# Patient Record
Sex: Female | Born: 1985
Health system: Southern US, Community
[De-identification: ages and names within clinical notes are randomized; demographics above are authoritative.]

## PROBLEM LIST (undated history)

## (undated) DIAGNOSIS — H539 Unspecified visual disturbance: Secondary | ICD-10-CM

## (undated) DIAGNOSIS — R002 Palpitations: Secondary | ICD-10-CM

## (undated) HISTORY — DX: Unspecified visual disturbance: H53.9

---
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
## 2004-05-17 ENCOUNTER — Encounter (INDEPENDENT_AMBULATORY_CARE_PROVIDER_SITE_OTHER): Payer: Self-pay | Admitting: *Deleted

## 2004-05-17 LAB — CONVERTED CEMR LAB

## 2004-10-16 ENCOUNTER — Ambulatory Visit: Payer: Self-pay | Admitting: Family Medicine

## 2007-01-15 ENCOUNTER — Encounter (INDEPENDENT_AMBULATORY_CARE_PROVIDER_SITE_OTHER): Payer: Self-pay | Admitting: *Deleted

## 2009-04-19 ENCOUNTER — Emergency Department (HOSPITAL_COMMUNITY): Admission: EM | Admit: 2009-04-19 | Discharge: 2009-04-19 | Payer: Self-pay | Admitting: Emergency Medicine

## 2012-08-13 ENCOUNTER — Encounter: Payer: Self-pay | Admitting: Obstetrics and Gynecology

## 2012-08-13 ENCOUNTER — Ambulatory Visit (INDEPENDENT_AMBULATORY_CARE_PROVIDER_SITE_OTHER): Payer: PRIVATE HEALTH INSURANCE | Admitting: Obstetrics and Gynecology

## 2012-08-13 VITALS — BP 100/60 | HR 72 | Resp 16 | Ht 65.0 in | Wt 136.0 lb

## 2012-08-13 DIAGNOSIS — Z124 Encounter for screening for malignant neoplasm of cervix: Secondary | ICD-10-CM

## 2012-08-13 DIAGNOSIS — Z309 Encounter for contraceptive management, unspecified: Secondary | ICD-10-CM

## 2012-08-13 DIAGNOSIS — N898 Other specified noninflammatory disorders of vagina: Secondary | ICD-10-CM

## 2012-08-13 DIAGNOSIS — N760 Acute vaginitis: Secondary | ICD-10-CM

## 2012-08-13 DIAGNOSIS — Z113 Encounter for screening for infections with a predominantly sexual mode of transmission: Secondary | ICD-10-CM

## 2012-08-13 DIAGNOSIS — A499 Bacterial infection, unspecified: Secondary | ICD-10-CM

## 2012-08-13 DIAGNOSIS — IMO0001 Reserved for inherently not codable concepts without codable children: Secondary | ICD-10-CM

## 2012-08-13 LAB — POCT WET PREP (WET MOUNT): Clue Cells Wet Prep Whiff POC: POSITIVE

## 2012-08-13 MED ORDER — MEDROXYPROGESTERONE ACETATE 150 MG/ML IM SUSP: 150.0000 mg | Freq: Once | INTRAMUSCULAR | Status: DC

## 2012-08-13 MED ORDER — TINIDAZOLE 500 MG PO TABS: 2.0000 g | ORAL_TABLET | Freq: Every day | ORAL | Status: DC

## 2012-08-13 NOTE — Progress Notes (Signed)
Contraception Condoms Last pap 11/2011 ? WNL Last Mammo None Last Colonoscopy None Last Dexa Scan None Primary MD Cindy Vertifaye Abuse at Home None  No complaints.  Wants to restart BC/Depo.  Cycle due to start on Sat or Sun.  Filed Vitals:   08/13/12 1406  BP: 100/60  Pulse: 72  Resp: 16   ROS: noncontributory  Physical Examination: General appearance - alert, well appearing, and in no distress Neck - supple, no significant adenopathy Chest - clear to auscultation, no wheezes, rales or rhonchi, symmetric air entry Heart - normal rate and regular rhythm Abdomen - soft, nontender, nondistended, no masses or organomegaly Breasts - breasts appear normal, no suspicious masses, no skin or nipple changes or axillary nodes Pelvic - normal external genitalia, vulva, vagina, cervix, uterus and adnexa, white d/c Back exam - no CVAT Extremities - no edema, redness or tenderness in the calves or thighs  Results for orders placed in visit on 08/13/12  POCT WET PREP (WET [place])      Component Value Range   Source Wet Prep POC       WBC, Wet Prep HPF POC neg     Bacteria Wet Prep HPF POC few     BACTERIA WET PREP MORPHOLOGY POC       Clue Cells Wet Prep HPF POC Few     CLUE CELLS WET PREP WHIFF POC Positive Whiff     Yeast Wet Prep HPF POC None     KOH Wet Prep POC       Trichomonas Wet Prep HPF POC neg     pH 5.0      A/P Pap today and STD testing with consent Wet Prep - BV - Tindamax Depo Provera - lab visit only on Mon with neg UPT (PT expected to be on cycle)

## 2012-08-14 LAB — RPR

## 2012-08-14 LAB — HEPATITIS B SURFACE ANTIGEN: Hepatitis B Surface Ag: NEGATIVE

## 2012-08-16 ENCOUNTER — Other Ambulatory Visit (INDEPENDENT_AMBULATORY_CARE_PROVIDER_SITE_OTHER): Payer: PRIVATE HEALTH INSURANCE

## 2012-08-16 DIAGNOSIS — Z3009 Encounter for other general counseling and advice on contraception: Secondary | ICD-10-CM

## 2012-08-16 LAB — HSV 1 ANTIBODY, IGG: HSV 1 Glycoprotein G Ab, IgG: <0.1 IV

## 2012-08-16 LAB — POCT URINE PREGNANCY: Preg Test, Ur: NEGATIVE

## 2012-08-16 LAB — HSV 2 ANTIBODY, IGG: HSV 2 Glycoprotein G Ab, IgG: <0.1 IV

## 2012-08-16 MED ORDER — MEDROXYPROGESTERONE ACETATE 150 MG/ML IM SUSP
150.0000 mg | Freq: Once | INTRAMUSCULAR | Status: AC
  Administered 2012-08-16: 150 mg via INTRAMUSCULAR

## 2012-08-16 NOTE — Progress Notes (Unsigned)
Next Depo due-11-07-2012

## 2012-08-18 LAB — PAP IG, CT-NG, RFX HPV ASCU: Chlamydia Probe Amp: NEGATIVE

## 2015-05-07 ENCOUNTER — Other Ambulatory Visit (HOSPITAL_COMMUNITY): Payer: Self-pay | Admitting: Obstetrics and Gynecology

## 2015-05-07 DIAGNOSIS — N7011 Chronic salpingitis: Secondary | ICD-10-CM

## 2015-05-10 ENCOUNTER — Ambulatory Visit (HOSPITAL_COMMUNITY): Payer: Self-pay

## 2016-12-11 ENCOUNTER — Encounter (HOSPITAL_COMMUNITY): Payer: Self-pay

## 2016-12-11 ENCOUNTER — Emergency Department (HOSPITAL_COMMUNITY): Payer: BLUE CROSS/BLUE SHIELD

## 2016-12-11 ENCOUNTER — Inpatient Hospital Stay (HOSPITAL_COMMUNITY)
Admission: EM | Admit: 2016-12-11 | Discharge: 2016-12-13 | DRG: 123 | Disposition: A | Discharge status: Home / Self Care | Payer: BLUE CROSS/BLUE SHIELD | Attending: Internal Medicine | Admitting: Internal Medicine

## 2016-12-11 DIAGNOSIS — H538 Other visual disturbances: Secondary | ICD-10-CM | POA: Diagnosis not present

## 2016-12-11 DIAGNOSIS — H469 Unspecified optic neuritis: Principal | ICD-10-CM | POA: Diagnosis present

## 2016-12-11 DIAGNOSIS — G379 Demyelinating disease of central nervous system, unspecified: Secondary | ICD-10-CM

## 2016-12-11 DIAGNOSIS — Z87891 Personal history of nicotine dependence: Secondary | ICD-10-CM

## 2016-12-11 DIAGNOSIS — G35 Multiple sclerosis: Secondary | ICD-10-CM | POA: Diagnosis present

## 2016-12-11 DIAGNOSIS — Z833 Family history of diabetes mellitus: Secondary | ICD-10-CM

## 2016-12-11 HISTORY — DX: Palpitations: R00.2

## 2016-12-11 LAB — I-STAT CHEM 8, ED
BUN: 7 mg/dL (ref 6–20)
CHLORIDE: 105 mmol/L (ref 101–111)
CREATININE: 0.8 mg/dL (ref 0.44–1.00)
Calcium, Ion: 1.14 mmol/L — ABNORMAL LOW (ref 1.15–1.40)
GLUCOSE: 92 mg/dL (ref 65–99)
HEMATOCRIT: 39 % (ref 36.0–46.0)
HEMOGLOBIN: 13.3 g/dL (ref 12.0–15.0)
POTASSIUM: 3.3 mmol/L — AB (ref 3.5–5.1)
Sodium: 139 mmol/L (ref 135–145)
TCO2: 21 mmol/L (ref 0–100)

## 2016-12-11 LAB — CBC WITH DIFFERENTIAL/PLATELET
BASOS ABS: 0 10*3/uL (ref 0.0–0.1)
BASOS PCT: 1 %
Eosinophils Absolute: 0.1 10*3/uL (ref 0.0–0.7)
Eosinophils Relative: 1 %
HEMATOCRIT: 37.1 % (ref 36.0–46.0)
HEMOGLOBIN: 12.4 g/dL (ref 12.0–15.0)
LYMPHS PCT: 48 %
Lymphs Abs: 2.9 10*3/uL (ref 0.7–4.0)
MCH: 28.6 pg (ref 26.0–34.0)
MCHC: 33.4 g/dL (ref 30.0–36.0)
MCV: 85.5 fL (ref 78.0–100.0)
MONO ABS: 0.4 10*3/uL (ref 0.1–1.0)
Monocytes Relative: 6 %
NEUTROS ABS: 2.6 10*3/uL (ref 1.7–7.7)
NEUTROS PCT: 44 %
Platelets: 238 10*3/uL (ref 150–400)
RBC: 4.34 MIL/uL (ref 3.87–5.11)
RDW: 12.5 % (ref 11.5–15.5)
WBC: 5.9 10*3/uL (ref 4.0–10.5)

## 2016-12-11 MED ORDER — GADOBENATE DIMEGLUMINE 529 MG/ML IV SOLN
10.0000 mL | Freq: Once | INTRAVENOUS | Status: AC | PRN
  Administered 2016-12-11: 10 mL via INTRAVENOUS

## 2016-12-11 MED ORDER — SODIUM CHLORIDE 0.9 % IV SOLN
1000.0000 mg | Freq: Once | INTRAVENOUS | Status: AC
  Administered 2016-12-12: 1000 mg via INTRAVENOUS
  Filled 2016-12-11: qty 8

## 2016-12-11 NOTE — ED Triage Notes (Signed)
Per Pt, Pt is coming from opthalmology where she was evaluated for blurred vision and pain behind the left eye. Pt was sent her to r/o optic neuritis with MRI.

## 2016-12-11 NOTE — ED Provider Notes (Signed)
[facility]-EMERGENCY DEPT Provider Note   CSN: [id number] Arrival date & time: 12/11/16  1652     History   Chief Complaint Chief Complaint  Patient presents with  . Blurred Vision    HPI Brianna Sanders is a 31 y.o. female.  HPI Brianna Sanders is a 31 y.o. female with no medical problems, presents to emergency department complaining of increased blurred vision left eye over the last 2 weeks. Patient states her symptoms started with peripheral vision blurriness in the left eye about a week and a half ago. It then progressed to having pain in that eye. She went and saw her ophthalmologist today, had extensive testing done, which showed decreased left peripheral field vision and inflammation around the nerve. She was sent here to rule out optic neuritis by Dr. Clelia Croft. Patient denies any other complaints. No associated symptoms other than a headache. Denies numbness or weakness in extremities. No history of eye problems other than decreased visual acuity for which she wears contacts and glasses. Denies any eye trauma. Nothing making her symptoms better or worse.   Past Medical History:  Diagnosis Date  . Palpitations     Patient Active Problem List   Diagnosis Date Noted  . Contraception - plans Depo 08/13/2012    History reviewed. No pertinent surgical history.  OB History    Gravida Para Term Preterm AB Living   0             SAB TAB Ectopic Multiple Live Births                   Home Medications    Prior to Admission medications   Medication Sig Start Date End Date Taking? Authorizing Provider  ibuprofen (ADVIL,MOTRIN) 800 MG tablet Take 800 mg by mouth every 8 (eight) hours as needed for cramping.   Yes Historical Provider, MD  prednisoLONE acetate (PRED FORTE) 1 % ophthalmic suspension Place 1 drop into the left eye every 6 (six) hours. 12/05/16  Yes Historical Provider, MD    Family History Family History  Problem Relation Age of Onset  . Diabetes Mother      Social History Social History  Substance Use Topics  . Smoking status: Former Smoker    Quit date: 01/14/2012  . Smokeless tobacco: Never Used  . Alcohol use Yes     Allergies   Patient has no known allergies.   Review of Systems Review of Systems  Constitutional: Negative for chills and fever.  Eyes: Positive for pain and visual disturbance. Negative for photophobia, discharge and redness.  Respiratory: Negative for cough, chest tightness and shortness of breath.   Cardiovascular: Negative for chest pain, palpitations and leg swelling.  Gastrointestinal: Negative for abdominal pain, diarrhea, nausea and vomiting.  Genitourinary: Negative for dysuria, flank pain and pelvic pain.  Musculoskeletal: Negative for arthralgias, myalgias, neck pain and neck stiffness.  Skin: Negative for rash.  Neurological: Positive for headaches. Negative for dizziness and weakness.  All other systems reviewed and are negative.    Physical Exam Updated Vital Signs BP 118/88 (BP Location: Right Arm)   Pulse 97   Temp 98.5 F (36.9 C) (Oral)   Resp 18   Ht 5\' 5"  (1.651 m)   Wt 56.7 kg   LMP 12/10/2016   SpO2 99%   BMI 20.80 kg/m   Physical Exam  Constitutional: She appears well-developed and well-nourished. No distress.  HENT:  Head: Normocephalic.  Eyes: Conjunctivae and EOM are normal. Pupils are equal, round,  and reactive to light.  Pain with lateral gaze in the left eye  Neck: Neck supple.  Cardiovascular: Normal rate, regular rhythm and normal heart sounds.   Pulmonary/Chest: Effort normal and breath sounds normal. No respiratory distress. She has no wheezes. She has no rales.  Musculoskeletal: She exhibits no edema.  Neurological: She is alert.  Deficit and left lateral visual fields. Otherwise normal neurological exam.5/5 and equal upper and lower extremity strength bilaterally. Equal grip strength bilaterally. Normal finger to nose and heel to shin. No pronator drift.  Patellar reflexes 2+   Skin: Skin is warm and dry.  Psychiatric: She has a normal mood and affect. Her behavior is normal.  Nursing note and vitals reviewed.    ED Treatments / Results  Labs (all labs ordered are listed, but only abnormal results are displayed) Labs Reviewed - No data to display  EKG  EKG Interpretation None       Radiology Mr Laqueta Jean And Wo Contrast  Result Date: 12/11/2016 CLINICAL DATA:  Initial evaluation for acute blurry vision with pain behind left eye. EXAM: MRI HEAD AND ORBITS WITHOUT AND WITH CONTRAST TECHNIQUE: Multiplanar, multiecho pulse sequences of the brain and surrounding structures were obtained without and with intravenous contrast. Multiplanar, multiecho pulse sequences of the orbits and surrounding structures were obtained including fat saturation techniques, before and after intravenous contrast administration. CONTRAST:  10mL MULTIHANCE GADOBENATE DIMEGLUMINE 529 MG/ML IV SOLN COMPARISON:  None available. FINDINGS: MRI HEAD FINDINGS Brain: Cerebral volume within normal limits for age. There are several scattered patchy T2/FLAIR hyperintense lesions involving predominantly in the periventricular white matter of both cerebral hemispheres, with a few additional small subcortical lesions present. Many of these lesions are oriented perpendicular to the lateral ventricles, and demonstrate precontrast T1 hypointense signal intensity. Overall distribution appearance is most consistent with underlying demyelinating disease, multiple sclerosis. Faint diffusion abnormality with associated enhancement about a few of these lesions is most consistent with active demyelination. No infratentorial lesions identified. No other evidence for acute infarct. No evidence for acute or chronic intracranial hemorrhage. No mass lesion, midline shift or mass effect. No hydrocephalus. Major dural sinuses are grossly patent. Pituitary gland mildly prominent with convex border  superiorly, likely within normal limits for age. Vascular: Major intracranial vascular flow voids are maintained. Skull and upper cervical spine: Craniocervical junction normal. Visualized upper cervical spine unremarkable. Bone marrow signal intensity somewhat diffusely decreased on T1 weighted signal intensity, most commonly related to anemia, smoking, and obesity. Scalp soft tissues demonstrate no acute abnormality. Other: Retention cyst noted within the right maxillary sinus. Scattered mucosal thickening within the ethmoidal air cells. Paranasal sinuses are otherwise clear. No mastoid effusion. Inner ear structures grossly normal. MRI ORBITS FINDINGS Orbits: Globes are symmetric in size with normal appearance. Extra-ocular muscles symmetric and within normal limits. Lacrimal glands normal. Superior ophthalmic veins within normal limits. Left optic nerve is somewhat diffusely enlarged as compared to the right. Scattered T2 hyperintensity surrounds the optic nerve and is present within the nerve itself, suggesting edema. There is associated fuzzy perineural enhancement, with additional discontinue areas of enhancement within the nerve itself. Changes consistent with acute optic neuritis. Changes extend to near the level of the left orbital apex. Optic chiasm itself is normal in appearance. Right optic nerve is normal in appearance. Intraconal and extraconal fat well-maintained. Visualized sinuses: Mucous retention cyst right maxillary sinus with scattered mucosal thickening within the ethmoidal air cells. Paranasal sinuses are otherwise clear. Soft tissues: Periorbital soft tissues within  normal limits. IMPRESSION: 1. Cerebral white matter disease in a distribution most consistent with demyelinating disease/multiple sclerosis. Mild enhancement about a few scattered lesions consistent with active demyelination. 2. Findings consistent with acute left optic neuritis as above. Electronically Signed   By: Rise Mu M.D.   On: 12/11/2016 23:39   Mr Rockwell [place] QM Contrast  Result Date: 12/11/2016 CLINICAL DATA:  Initial evaluation for acute blurry vision with pain behind left eye. EXAM: MRI HEAD AND ORBITS WITHOUT AND WITH CONTRAST TECHNIQUE: Multiplanar, multiecho pulse sequences of the brain and surrounding structures were obtained without and with intravenous contrast. Multiplanar, multiecho pulse sequences of the orbits and surrounding structures were obtained including fat saturation techniques, before and after intravenous contrast administration. CONTRAST:  10mL MULTIHANCE GADOBENATE DIMEGLUMINE 529 MG/ML IV SOLN COMPARISON:  None available. FINDINGS: MRI HEAD FINDINGS Brain: Cerebral volume within normal limits for age. There are several scattered patchy T2/FLAIR hyperintense lesions involving predominantly in the periventricular white matter of both cerebral hemispheres, with a few additional small subcortical lesions present. Many of these lesions are oriented perpendicular to the lateral ventricles, and demonstrate precontrast T1 hypointense signal intensity. Overall distribution appearance is most consistent with underlying demyelinating disease, multiple sclerosis. Faint diffusion abnormality with associated enhancement about a few of these lesions is most consistent with active demyelination. No infratentorial lesions identified. No other evidence for acute infarct. No evidence for acute or chronic intracranial hemorrhage. No mass lesion, midline shift or mass effect. No hydrocephalus. Major dural sinuses are grossly patent. Pituitary gland mildly prominent with convex border superiorly, likely within normal limits for age. Vascular: Major intracranial vascular flow voids are maintained. Skull and upper cervical spine: Craniocervical junction normal. Visualized upper cervical spine unremarkable. Bone marrow signal intensity somewhat diffusely decreased on T1 weighted signal intensity, most commonly  related to anemia, smoking, and obesity. Scalp soft tissues demonstrate no acute abnormality. Other: Retention cyst noted within the right maxillary sinus. Scattered mucosal thickening within the ethmoidal air cells. Paranasal sinuses are otherwise clear. No mastoid effusion. Inner ear structures grossly normal. MRI ORBITS FINDINGS Orbits: Globes are symmetric in size with normal appearance. Extra-ocular muscles symmetric and within normal limits. Lacrimal glands normal. Superior ophthalmic veins within normal limits. Left optic nerve is somewhat diffusely enlarged as compared to the right. Scattered T2 hyperintensity surrounds the optic nerve and is present within the nerve itself, suggesting edema. There is associated fuzzy perineural enhancement, with additional discontinue areas of enhancement within the nerve itself. Changes consistent with acute optic neuritis. Changes extend to near the level of the left orbital apex. Optic chiasm itself is normal in appearance. Right optic nerve is normal in appearance. Intraconal and extraconal fat well-maintained. Visualized sinuses: Mucous retention cyst right maxillary sinus with scattered mucosal thickening within the ethmoidal air cells. Paranasal sinuses are otherwise clear. Soft tissues: Periorbital soft tissues within normal limits. IMPRESSION: 1. Cerebral white matter disease in a distribution most consistent with demyelinating disease/multiple sclerosis. Mild enhancement about a few scattered lesions consistent with active demyelination. 2. Findings consistent with acute left optic neuritis as above. Electronically Signed   By: Rise Mu M.D.   On: 12/11/2016 23:39    Procedures Procedures (including critical care time)  Medications Ordered in ED Medications - No data to display   Initial Impression / Assessment and Plan / ED Course  I have reviewed the triage vital signs and the nursing notes.  Pertinent labs & imaging results that were  available during my care of  the patient were reviewed by me and considered in my medical decision making (see chart for details).    Patient in emergency department with visual changes, sent by ophthalmologist with concern for optic neuritis. MRI of the brain and orbits ordered.  12:11 AM MRI with findings consistent with optic neuritis along with demyelinating lesions in cerebral white matter disease. Discussed with Dr. Vonita Moss with neurology, he will come and consult. Patient was given 1 g of Solu-Medrol IV. Results discussed with patient. Will call medicine for admission.  Spoke with hospitalist, will admit.    Final Clinical Impressions(s) / ED Diagnoses   Final diagnoses:  Blurred vision  Optic neuritis  Demyelinating changes in brain [facility])    New Prescriptions    Jaynie Crumble, PA-C 12/13/16 2219    Linwood Dibbles, MD 12/16/16 [phone]

## 2016-12-12 DIAGNOSIS — H538 Other visual disturbances: Secondary | ICD-10-CM | POA: Diagnosis present

## 2016-12-12 DIAGNOSIS — H469 Unspecified optic neuritis: Secondary | ICD-10-CM | POA: Diagnosis not present

## 2016-12-12 DIAGNOSIS — G379 Demyelinating disease of central nervous system, unspecified: Secondary | ICD-10-CM | POA: Diagnosis not present

## 2016-12-12 DIAGNOSIS — G35 Multiple sclerosis: Secondary | ICD-10-CM | POA: Diagnosis present

## 2016-12-12 DIAGNOSIS — Z87891 Personal history of nicotine dependence: Secondary | ICD-10-CM | POA: Diagnosis not present

## 2016-12-12 DIAGNOSIS — Z833 Family history of diabetes mellitus: Secondary | ICD-10-CM | POA: Diagnosis not present

## 2016-12-12 LAB — GLUCOSE, CAPILLARY
GLUCOSE-CAPILLARY: 106 mg/dL — AB (ref 65–99)
GLUCOSE-CAPILLARY: 134 mg/dL — AB (ref 65–99)
GLUCOSE-CAPILLARY: 154 mg/dL — AB (ref 65–99)
Glucose-Capillary: 163 mg/dL — ABNORMAL HIGH (ref 65–99)

## 2016-12-12 MED ORDER — SODIUM CHLORIDE 0.9 % IV SOLN
1000.0000 mg | INTRAVENOUS | Status: DC
  Administered 2016-12-13: 1000 mg via INTRAVENOUS
  Filled 2016-12-12: qty 8

## 2016-12-12 MED ORDER — ENOXAPARIN SODIUM 40 MG/0.4ML ~~LOC~~ SOLN
40.0000 mg | SUBCUTANEOUS | Status: DC
  Administered 2016-12-12 – 2016-12-13 (×2): 40 mg via SUBCUTANEOUS
  Filled 2016-12-12 (×2): qty 0.4

## 2016-12-12 NOTE — Progress Notes (Signed)
PROGRESS NOTE  Alizae Kubicek ZOX:[id number] DOB: [birth year] DOA: 12/11/2016 PCP: Aurelio Brash, NP  HPI/Recap of past 24 hours:  Feeling better, no pain, no fever, no weakness Sister in room  Assessment/Plan: Principal Problem:   Optic neuritis Active Problems:   Demyelinating changes in brain [facility])  Multiple sclerosis/left optic neuritis: Neurology consulted , on high dose steroids. Will follow neurology recommendations.   Code Status: full  Family Communication: patient and sister at bedside  Disposition Plan: home after finishing iv solumedrol   Consultants:  neurology  Procedures:  none  Antibiotics:  none   Objective: BP 108/64 (BP Location: Left Arm)   Pulse 73   Temp 97.9 F (36.6 C) (Oral)   Resp 18   Ht 5\' 5"  (1.651 m)   Wt 55.3 kg (122 lb)   LMP 12/10/2016   SpO2 100%   BMI 20.30 kg/m  No intake or output data in the 24 hours ending 12/12/16 0810 Filed Weights   12/11/16 1702 12/12/16 0254  Weight: 56.7 kg (125 lb) 55.3 kg (122 lb)    Exam:   General:  NAD  Cardiovascular: RRR  Respiratory: CTABL  Abdomen: Soft/ND/NT, positive BS  Musculoskeletal: No Edema  Neuro: blurry vision left eye, no weakness  Data Reviewed: Basic Metabolic Panel:  Recent Labs Lab 12/11/16 2151  NA 139  K 3.3*  CL 105  GLUCOSE 92  BUN 7  CREATININE 0.80   Liver Function Tests: No results for input(s): AST, ALT, ALKPHOS, BILITOT, PROT, ALBUMIN in the last 168 hours. No results for input(s): LIPASE, AMYLASE in the last 168 hours. No results for input(s): AMMONIA in the last 168 hours. CBC:  Recent Labs Lab 12/11/16 2149 12/11/16 2151  WBC 5.9  --   NEUTROABS 2.6  --   HGB 12.4 13.3  HCT 37.1 39.0  MCV 85.5  --   PLT 238  --    Cardiac Enzymes:   No results for input(s): CKTOTAL, CKMB, CKMBINDEX, TROPONINI in the last 168 hours. BNP (last 3 results) No results for input(s): BNP in the last 8760 hours.  ProBNP (last  3 results) No results for input(s): PROBNP in the last 8760 hours.  CBG:  Recent Labs Lab 12/12/16 0630  GLUCAP 106*    No results found for this or any previous visit (from the past 240 hour(s)).   Studies: Mr Laqueta Jean And Wo Contrast  Result Date: 12/11/2016 CLINICAL DATA:  Initial evaluation for acute blurry vision with pain behind left eye. EXAM: MRI HEAD AND ORBITS WITHOUT AND WITH CONTRAST TECHNIQUE: Multiplanar, multiecho pulse sequences of the brain and surrounding structures were obtained without and with intravenous contrast. Multiplanar, multiecho pulse sequences of the orbits and surrounding structures were obtained including fat saturation techniques, before and after intravenous contrast administration. CONTRAST:  10mL MULTIHANCE GADOBENATE DIMEGLUMINE 529 MG/ML IV SOLN COMPARISON:  None available. FINDINGS: MRI HEAD FINDINGS Brain: Cerebral volume within normal limits for age. There are several scattered patchy T2/FLAIR hyperintense lesions involving predominantly in the periventricular white matter of both cerebral hemispheres, with a few additional small subcortical lesions present. Many of these lesions are oriented perpendicular to the lateral ventricles, and demonstrate precontrast T1 hypointense signal intensity. Overall distribution appearance is most consistent with underlying demyelinating disease, multiple sclerosis. Faint diffusion abnormality with associated enhancement about a few of these lesions is most consistent with active demyelination. No infratentorial lesions identified. No other evidence for acute infarct. No evidence for acute or chronic intracranial hemorrhage.  No mass lesion, midline shift or mass effect. No hydrocephalus. Major dural sinuses are grossly patent. Pituitary gland mildly prominent with convex border superiorly, likely within normal limits for age. Vascular: Major intracranial vascular flow voids are maintained. Skull and upper cervical spine:  Craniocervical junction normal. Visualized upper cervical spine unremarkable. Bone marrow signal intensity somewhat diffusely decreased on T1 weighted signal intensity, most commonly related to anemia, smoking, and obesity. Scalp soft tissues demonstrate no acute abnormality. Other: Retention cyst noted within the right maxillary sinus. Scattered mucosal thickening within the ethmoidal air cells. Paranasal sinuses are otherwise clear. No mastoid effusion. Inner ear structures grossly normal. MRI ORBITS FINDINGS Orbits: Globes are symmetric in size with normal appearance. Extra-ocular muscles symmetric and within normal limits. Lacrimal glands normal. Superior ophthalmic veins within normal limits. Left optic nerve is somewhat diffusely enlarged as compared to the right. Scattered T2 hyperintensity surrounds the optic nerve and is present within the nerve itself, suggesting edema. There is associated fuzzy perineural enhancement, with additional discontinue areas of enhancement within the nerve itself. Changes consistent with acute optic neuritis. Changes extend to near the level of the left orbital apex. Optic chiasm itself is normal in appearance. Right optic nerve is normal in appearance. Intraconal and extraconal fat well-maintained. Visualized sinuses: Mucous retention cyst right maxillary sinus with scattered mucosal thickening within the ethmoidal air cells. Paranasal sinuses are otherwise clear. Soft tissues: Periorbital soft tissues within normal limits. IMPRESSION: 1. Cerebral white matter disease in a distribution most consistent with demyelinating disease/multiple sclerosis. Mild enhancement about a few scattered lesions consistent with active demyelination. 2. Findings consistent with acute left optic neuritis as above. Electronically Signed   By: Rise Mu M.D.   On: 12/11/2016 23:39   Mr Rockwell [place] ZO Contrast  Result Date: 12/11/2016 CLINICAL DATA:  Initial evaluation for acute blurry  vision with pain behind left eye. EXAM: MRI HEAD AND ORBITS WITHOUT AND WITH CONTRAST TECHNIQUE: Multiplanar, multiecho pulse sequences of the brain and surrounding structures were obtained without and with intravenous contrast. Multiplanar, multiecho pulse sequences of the orbits and surrounding structures were obtained including fat saturation techniques, before and after intravenous contrast administration. CONTRAST:  10mL MULTIHANCE GADOBENATE DIMEGLUMINE 529 MG/ML IV SOLN COMPARISON:  None available. FINDINGS: MRI HEAD FINDINGS Brain: Cerebral volume within normal limits for age. There are several scattered patchy T2/FLAIR hyperintense lesions involving predominantly in the periventricular white matter of both cerebral hemispheres, with a few additional small subcortical lesions present. Many of these lesions are oriented perpendicular to the lateral ventricles, and demonstrate precontrast T1 hypointense signal intensity. Overall distribution appearance is most consistent with underlying demyelinating disease, multiple sclerosis. Faint diffusion abnormality with associated enhancement about a few of these lesions is most consistent with active demyelination. No infratentorial lesions identified. No other evidence for acute infarct. No evidence for acute or chronic intracranial hemorrhage. No mass lesion, midline shift or mass effect. No hydrocephalus. Major dural sinuses are grossly patent. Pituitary gland mildly prominent with convex border superiorly, likely within normal limits for age. Vascular: Major intracranial vascular flow voids are maintained. Skull and upper cervical spine: Craniocervical junction normal. Visualized upper cervical spine unremarkable. Bone marrow signal intensity somewhat diffusely decreased on T1 weighted signal intensity, most commonly related to anemia, smoking, and obesity. Scalp soft tissues demonstrate no acute abnormality. Other: Retention cyst noted within the right maxillary  sinus. Scattered mucosal thickening within the ethmoidal air cells. Paranasal sinuses are otherwise clear. No mastoid effusion. Inner ear structures grossly normal.  MRI ORBITS FINDINGS Orbits: Globes are symmetric in size with normal appearance. Extra-ocular muscles symmetric and within normal limits. Lacrimal glands normal. Superior ophthalmic veins within normal limits. Left optic nerve is somewhat diffusely enlarged as compared to the right. Scattered T2 hyperintensity surrounds the optic nerve and is present within the nerve itself, suggesting edema. There is associated fuzzy perineural enhancement, with additional discontinue areas of enhancement within the nerve itself. Changes consistent with acute optic neuritis. Changes extend to near the level of the left orbital apex. Optic chiasm itself is normal in appearance. Right optic nerve is normal in appearance. Intraconal and extraconal fat well-maintained. Visualized sinuses: Mucous retention cyst right maxillary sinus with scattered mucosal thickening within the ethmoidal air cells. Paranasal sinuses are otherwise clear. Soft tissues: Periorbital soft tissues within normal limits. IMPRESSION: 1. Cerebral white matter disease in a distribution most consistent with demyelinating disease/multiple sclerosis. Mild enhancement about a few scattered lesions consistent with active demyelination. 2. Findings consistent with acute left optic neuritis as above. Electronically Signed   By: Rise Mu M.D.   On: 12/11/2016 23:39    Scheduled Meds: . enoxaparin (LOVENOX) injection  40 mg Subcutaneous Q24H  . [START ON 12/13/2016] methylPREDNISolone (SOLU-MEDROL) injection  1,000 mg Intravenous Q24H    Continuous Infusions:    Adalida Garver MD, PhD  [facility] Pager [phone]. If 7PM-7AM, please contact night-coverage at [email or web address], password [facility] 12/12/2016, 8:10 AM  LOS: 0 days

## 2016-12-12 NOTE — Progress Notes (Signed)
Pt does have a PCP and insurance Herbalist). Information faxed to financial counseling. Pt sees Dr Patsey Berthold for her PCP. CM following for further needs.

## 2016-12-12 NOTE — H&P (Signed)
History and Physical    Sokha Kramm WUJ:[id number] DOB: Apr 10, 1986 DOA: 12/11/2016   PCP: Aurelio Brash, NP Chief Complaint:  Chief Complaint  Patient presents with  . Blurred Vision    HPI: Meganne Daidone is a 31 y.o. female with medical history significant of Previously healthy.  Patient presents to the ED with c/o blurred vision and pain behind L eye.  Patient is sent in by opthomology after their physical exam was worrisome for optic neuritis!  Symptoms onset 2 weeks ago, progressively worsening.  Nothing makes symptoms better or worse.  As mentioned above saw optho today who sent her in to ED rule out optic neuritis.  ED Course: MRI findings c/w L optic neuritis, and multiple sclerosis.  Review of Systems: As per HPI otherwise 10 point review of systems negative.    Past Medical History:  Diagnosis Date  . Palpitations     History reviewed. No pertinent surgical history.   reports that she quit smoking about 4 years ago. She has never used smokeless tobacco. She reports that she drinks alcohol. She reports that she does not use drugs.  No Known Allergies  Family History  Problem Relation Age of Onset  . Diabetes Mother       Prior to Admission medications   Medication Sig Start Date End Date Taking? Authorizing Provider  ibuprofen (ADVIL,MOTRIN) 800 MG tablet Take 800 mg by mouth every 8 (eight) hours as needed for cramping.   Yes Historical Provider, MD  prednisoLONE acetate (PRED FORTE) 1 % ophthalmic suspension Place 1 drop into the left eye every 6 (six) hours. 12/05/16  Yes Historical Provider, MD    Physical Exam: Vitals:   12/11/16 1702 12/11/16 2115 12/11/16 2145 12/12/16 0136  BP: 118/88 130/88 118/86 120/82  Pulse: 97 87 92 77  Resp: 18 25 20 18   Temp: 98.5 F (36.9 C)     TempSrc: Oral     SpO2: 99% 100% 100% 100%  Weight: 56.7 kg (125 lb)     Height: 5\' 5"  (1.651 m)         Constitutional: NAD, calm, comfortable Eyes:  PERRL, lids and conjunctivae normal ENMT: Mucous membranes are moist. Posterior pharynx clear of any exudate or lesions.Normal dentition.  Neck: normal, supple, no masses, no thyromegaly Respiratory: clear to auscultation bilaterally, no wheezing, no crackles. Normal respiratory effort. No accessory muscle use.  Cardiovascular: Regular rate and rhythm, no murmurs / rubs / gallops. No extremity edema. 2+ pedal pulses. No carotid bruits.  Abdomen: no tenderness, no masses palpated. No hepatosplenomegaly. Bowel sounds positive.  Musculoskeletal: no clubbing / cyanosis. No joint deformity upper and lower extremities. Good ROM, no contractures. Normal muscle tone.  Skin: no rashes, lesions, ulcers. No induration Neurologic: CN 2-12 grossly intact. Sensation intact, DTR normal. Strength 5/5 in all 4.  Psychiatric: Normal judgment and insight. Alert and oriented x 3. Normal mood.    Labs on Admission: I have personally reviewed following labs and imaging studies  CBC:  Recent Labs Lab 12/11/16 2149 12/11/16 2151  WBC 5.9  --   NEUTROABS 2.6  --   HGB 12.4 13.3  HCT 37.1 39.0  MCV 85.5  --   PLT 238  --    Basic Metabolic Panel:  Recent Labs Lab 12/11/16 2151  NA 139  K 3.3*  CL 105  GLUCOSE 92  BUN 7  CREATININE 0.80   GFR: Estimated Creatinine Clearance: 91.2 mL/min (by C-G formula based on SCr of 0.8 mg/dL).  Liver Function Tests: No results for input(s): AST, ALT, ALKPHOS, BILITOT, PROT, ALBUMIN in the last 168 hours. No results for input(s): LIPASE, AMYLASE in the last 168 hours. No results for input(s): AMMONIA in the last 168 hours. Coagulation Profile: No results for input(s): INR, PROTIME in the last 168 hours. Cardiac Enzymes: No results for input(s): CKTOTAL, CKMB, CKMBINDEX, TROPONINI in the last 168 hours. BNP (last 3 results) No results for input(s): PROBNP in the last 8760 hours. HbA1C: No results for input(s): HGBA1C in the last 72 hours. CBG: No results  for input(s): GLUCAP in the last 168 hours. Lipid Profile: No results for input(s): CHOL, HDL, LDLCALC, TRIG, CHOLHDL, LDLDIRECT in the last 72 hours. Thyroid Function Tests: No results for input(s): TSH, T4TOTAL, FREET4, T3FREE, THYROIDAB in the last 72 hours. Anemia Panel: No results for input(s): VITAMINB12, FOLATE, FERRITIN, TIBC, IRON, RETICCTPCT in the last 72 hours. Urine analysis:    Component Value Date/Time   PHURINE 5.0 08/13/2012 1442   Sepsis Labs: @LABRCNTIP (procalcitonin:4,lacticidven:4) )No results found for this or any previous visit (from the past 240 hour(s)).   Radiological Exams on Admission: Mr Laqueta Jean And Wo Contrast  Result Date: 12/11/2016 CLINICAL DATA:  Initial evaluation for acute blurry vision with pain behind left eye. EXAM: MRI HEAD AND ORBITS WITHOUT AND WITH CONTRAST TECHNIQUE: Multiplanar, multiecho pulse sequences of the brain and surrounding structures were obtained without and with intravenous contrast. Multiplanar, multiecho pulse sequences of the orbits and surrounding structures were obtained including fat saturation techniques, before and after intravenous contrast administration. CONTRAST:  10mL MULTIHANCE GADOBENATE DIMEGLUMINE 529 MG/ML IV SOLN COMPARISON:  None available. FINDINGS: MRI HEAD FINDINGS Brain: Cerebral volume within normal limits for age. There are several scattered patchy T2/FLAIR hyperintense lesions involving predominantly in the periventricular white matter of both cerebral hemispheres, with a few additional small subcortical lesions present. Many of these lesions are oriented perpendicular to the lateral ventricles, and demonstrate precontrast T1 hypointense signal intensity. Overall distribution appearance is most consistent with underlying demyelinating disease, multiple sclerosis. Faint diffusion abnormality with associated enhancement about a few of these lesions is most consistent with active demyelination. No infratentorial  lesions identified. No other evidence for acute infarct. No evidence for acute or chronic intracranial hemorrhage. No mass lesion, midline shift or mass effect. No hydrocephalus. Major dural sinuses are grossly patent. Pituitary gland mildly prominent with convex border superiorly, likely within normal limits for age. Vascular: Major intracranial vascular flow voids are maintained. Skull and upper cervical spine: Craniocervical junction normal. Visualized upper cervical spine unremarkable. Bone marrow signal intensity somewhat diffusely decreased on T1 weighted signal intensity, most commonly related to anemia, smoking, and obesity. Scalp soft tissues demonstrate no acute abnormality. Other: Retention cyst noted within the right maxillary sinus. Scattered mucosal thickening within the ethmoidal air cells. Paranasal sinuses are otherwise clear. No mastoid effusion. Inner ear structures grossly normal. MRI ORBITS FINDINGS Orbits: Globes are symmetric in size with normal appearance. Extra-ocular muscles symmetric and within normal limits. Lacrimal glands normal. Superior ophthalmic veins within normal limits. Left optic nerve is somewhat diffusely enlarged as compared to the right. Scattered T2 hyperintensity surrounds the optic nerve and is present within the nerve itself, suggesting edema. There is associated fuzzy perineural enhancement, with additional discontinue areas of enhancement within the nerve itself. Changes consistent with acute optic neuritis. Changes extend to near the level of the left orbital apex. Optic chiasm itself is normal in appearance. Right optic nerve is normal in appearance. Intraconal and  extraconal fat well-maintained. Visualized sinuses: Mucous retention cyst right maxillary sinus with scattered mucosal thickening within the ethmoidal air cells. Paranasal sinuses are otherwise clear. Soft tissues: Periorbital soft tissues within normal limits. IMPRESSION: 1. Cerebral white matter disease  in a distribution most consistent with demyelinating disease/multiple sclerosis. Mild enhancement about a few scattered lesions consistent with active demyelination. 2. Findings consistent with acute left optic neuritis as above. Electronically Signed   By: Rise Mu M.D.   On: 12/11/2016 23:39   Mr Rockwell [place] ZO Contrast  Result Date: 12/11/2016 CLINICAL DATA:  Initial evaluation for acute blurry vision with pain behind left eye. EXAM: MRI HEAD AND ORBITS WITHOUT AND WITH CONTRAST TECHNIQUE: Multiplanar, multiecho pulse sequences of the brain and surrounding structures were obtained without and with intravenous contrast. Multiplanar, multiecho pulse sequences of the orbits and surrounding structures were obtained including fat saturation techniques, before and after intravenous contrast administration. CONTRAST:  10mL MULTIHANCE GADOBENATE DIMEGLUMINE 529 MG/ML IV SOLN COMPARISON:  None available. FINDINGS: MRI HEAD FINDINGS Brain: Cerebral volume within normal limits for age. There are several scattered patchy T2/FLAIR hyperintense lesions involving predominantly in the periventricular white matter of both cerebral hemispheres, with a few additional small subcortical lesions present. Many of these lesions are oriented perpendicular to the lateral ventricles, and demonstrate precontrast T1 hypointense signal intensity. Overall distribution appearance is most consistent with underlying demyelinating disease, multiple sclerosis. Faint diffusion abnormality with associated enhancement about a few of these lesions is most consistent with active demyelination. No infratentorial lesions identified. No other evidence for acute infarct. No evidence for acute or chronic intracranial hemorrhage. No mass lesion, midline shift or mass effect. No hydrocephalus. Major dural sinuses are grossly patent. Pituitary gland mildly prominent with convex border superiorly, likely within normal limits for age. Vascular: Major  intracranial vascular flow voids are maintained. Skull and upper cervical spine: Craniocervical junction normal. Visualized upper cervical spine unremarkable. Bone marrow signal intensity somewhat diffusely decreased on T1 weighted signal intensity, most commonly related to anemia, smoking, and obesity. Scalp soft tissues demonstrate no acute abnormality. Other: Retention cyst noted within the right maxillary sinus. Scattered mucosal thickening within the ethmoidal air cells. Paranasal sinuses are otherwise clear. No mastoid effusion. Inner ear structures grossly normal. MRI ORBITS FINDINGS Orbits: Globes are symmetric in size with normal appearance. Extra-ocular muscles symmetric and within normal limits. Lacrimal glands normal. Superior ophthalmic veins within normal limits. Left optic nerve is somewhat diffusely enlarged as compared to the right. Scattered T2 hyperintensity surrounds the optic nerve and is present within the nerve itself, suggesting edema. There is associated fuzzy perineural enhancement, with additional discontinue areas of enhancement within the nerve itself. Changes consistent with acute optic neuritis. Changes extend to near the level of the left orbital apex. Optic chiasm itself is normal in appearance. Right optic nerve is normal in appearance. Intraconal and extraconal fat well-maintained. Visualized sinuses: Mucous retention cyst right maxillary sinus with scattered mucosal thickening within the ethmoidal air cells. Paranasal sinuses are otherwise clear. Soft tissues: Periorbital soft tissues within normal limits. IMPRESSION: 1. Cerebral white matter disease in a distribution most consistent with demyelinating disease/multiple sclerosis. Mild enhancement about a few scattered lesions consistent with active demyelination. 2. Findings consistent with acute left optic neuritis as above. Electronically Signed   By: Rise Mu M.D.   On: 12/11/2016 23:39    EKG: Independently  reviewed.  Assessment/Plan Principal Problem:   Optic neuritis Active Problems:   Demyelinating changes in brain (HCC)  1. Probable Multiple Sclerosis, new diagnosis -  MRI findings of white matter demyelination, and L optic neuritis. 1. Neurology consulting 2. 1gm solumedrol QD x3 days ordered at their instruction.   DVT prophylaxis: Lovenox Code Status: Full Family Communication: Family at bedside Consults called: Neurology Admission status: Admit to inpatient   Hillary Bow DO [facility] Pager [phone] from 7PM-7AM  If 7AM-7PM, please contact the day physician for the patient [email or web address] Password [email or web address]  12/12/2016, 1:59 AM

## 2016-12-12 NOTE — Consult Note (Signed)
Admission H&P    Chief Complaint: Blurred vision and pain involving left eye.  HPI: Brianna Sanders is an 31 y.o. female with no known medical disorder, presenting with complaint of pain and blurred vision involving left eye. Onset was on 11/30/2016. She was seen by ophthalmology on 12/11/2016 and referred to [facility] for further management of probable left optic neuritis and possible demyelinating disorder. MRI of the orbits showed findings consistent with acute left optic neuritis. MRI of the brain showed multiple small lesions bilaterally involving white matter, indicative of probable demyelinating disorder, including mild enhancement of a few lesions. Patient has not experienced ocular symptoms previously, and has not experienced any focal weakness or numbness nor unstable gait or coordination abnormality.  Past Medical History:  Diagnosis Date  . Palpitations     History reviewed. No pertinent surgical history.  Family History  Problem Relation Age of Onset  . Diabetes Mother    Social History:  reports that she quit smoking about 4 years ago. She has never used smokeless tobacco. She reports that she drinks alcohol. She reports that she does not use drugs.  Allergies: No Known Allergies  Medications: Preadmission medications were reviewed by me.  ROS: History obtained from the patient  General ROS: negative for - chills, fatigue, fever, night sweats, weight gain or weight loss Psychological ROS: negative for - behavioral disorder, hallucinations, memory difficulties, mood swings or suicidal ideation Ophthalmic ROS: negative for - blurry vision, double vision, eye pain or loss of vision ENT ROS: negative for - epistaxis, nasal discharge, oral lesions, sore throat, tinnitus or vertigo Allergy and Immunology ROS: negative for - hives or itchy/watery eyes Hematological and Lymphatic ROS: negative for - bleeding problems, bruising or swollen lymph nodes Endocrine ROS: negative for -  galactorrhea, hair pattern changes, polydipsia/polyuria or temperature intolerance Respiratory ROS: negative for - cough, hemoptysis, shortness of breath or wheezing Cardiovascular ROS: negative for - chest pain, dyspnea on exertion, edema or irregular heartbeat Gastrointestinal ROS: negative for - abdominal pain, diarrhea, hematemesis, nausea/vomiting or stool incontinence Genito-Urinary ROS: negative for - dysuria, hematuria, incontinence or urinary frequency/urgency Musculoskeletal ROS: negative for - joint swelling or muscular weakness Neurological ROS: as noted in HPI Dermatological ROS: negative for rash and skin lesion changes  Physical Examination: Blood pressure 120/82, pulse 77, temperature 98.5 F (36.9 C), temperature source Oral, resp. rate 18, height 5\' 5"  (1.651 m), weight 56.7 kg (125 lb), last menstrual period 12/10/2016, SpO2 100 %.  HEENT-  Normocephalic, no lesions, without obvious abnormality.  Normal external eye and conjunctiva.  Normal TM's bilaterally.  Normal auditory canals and external ears. Normal external nose, mucus membranes and septum.  Normal pharynx. Neck supple with no masses, nodes, nodules or enlargement. Cardiovascular - regular rate and rhythm, S1, S2 normal, no murmur, click, rub or gallop Lungs - chest clear, no wheezing, rales, normal symmetric air entry Abdomen - soft, non-tender; bowel sounds normal; no masses,  no organomegaly Extremities - no joint deformities, effusion, or inflammation and no edema  Neurologic Examination: Mental Status: Alert, oriented, thought content appropriate.  Speech fluent without evidence of aphasia. Able to follow commands without difficulty. Cranial Nerves: II-Visual fields were normal. III/IV/VI-Pupils were equal and reacted normally to light. There was no afferent defect noted. Extraocular movements were full and conjugate.    V/VII-no facial numbness involving left cheek; no facial  weakness. VIII-normal. X-normal speech and symmetrical palatal movement. XI: trapezius strength/neck flexion strength normal bilaterally XII-midline tongue extension with normal strength. Motor:  5/5 bilaterally with normal tone and bulk Sensory: Normal throughout. Deep Tendon Reflexes: 2+ and symmetric. Plantars: Flexor bilaterally Cerebellar: Normal finger-to-nose testing. Carotid auscultation: Normal  Results for orders placed or performed during the hospital encounter of 12/11/16 (from the past 48 hour(s))  CBC with Differential     Status: None   Collection Time: 12/11/16  9:49 PM  Result Value Ref Range   WBC 5.9 4.0 - 10.5 K/uL   RBC 4.34 3.87 - 5.11 MIL/uL   Hemoglobin 12.4 12.0 - 15.0 g/dL   HCT 16.1 09.6 - 04.5 %   MCV 85.5 78.0 - 100.0 fL   MCH 28.6 26.0 - 34.0 pg   MCHC 33.4 30.0 - 36.0 g/dL   RDW 40.9 81.1 - 91.4 %   Platelets 238 150 - 400 K/uL   Neutrophils Relative % 44 %   Neutro Abs 2.6 1.7 - 7.7 K/uL   Lymphocytes Relative 48 %   Lymphs Abs 2.9 0.7 - 4.0 K/uL   Monocytes Relative 6 %   Monocytes Absolute 0.4 0.1 - 1.0 K/uL   Eosinophils Relative 1 %   Eosinophils Absolute 0.1 0.0 - 0.7 K/uL   Basophils Relative 1 %   Basophils Absolute 0.0 0.0 - 0.1 K/uL  I-Stat Chem 8, ED     Status: Abnormal   Collection Time: 12/11/16  9:51 PM  Result Value Ref Range   Sodium 139 135 - 145 mmol/L   Potassium 3.3 (L) 3.5 - 5.1 mmol/L   Chloride 105 101 - 111 mmol/L   BUN 7 6 - 20 mg/dL   Creatinine, Ser 7.82 0.44 - 1.00 mg/dL   Glucose, Bld 92 65 - 99 mg/dL   Calcium, Ion 9.56 (L) 1.15 - 1.40 mmol/L   TCO2 21 0 - 100 mmol/L   Hemoglobin 13.3 12.0 - 15.0 g/dL   HCT 21.3 08.6 - 57.8 %   Mr Brain W And Wo Contrast  Result Date: 12/11/2016 CLINICAL DATA:  Initial evaluation for acute blurry vision with pain behind left eye. EXAM: MRI HEAD AND ORBITS WITHOUT AND WITH CONTRAST TECHNIQUE: Multiplanar, multiecho pulse sequences of the brain and surrounding structures  were obtained without and with intravenous contrast. Multiplanar, multiecho pulse sequences of the orbits and surrounding structures were obtained including fat saturation techniques, before and after intravenous contrast administration. CONTRAST:  10mL MULTIHANCE GADOBENATE DIMEGLUMINE 529 MG/ML IV SOLN COMPARISON:  None available. FINDINGS: MRI HEAD FINDINGS Brain: Cerebral volume within normal limits for age. There are several scattered patchy T2/FLAIR hyperintense lesions involving predominantly in the periventricular white matter of both cerebral hemispheres, with a few additional small subcortical lesions present. Many of these lesions are oriented perpendicular to the lateral ventricles, and demonstrate precontrast T1 hypointense signal intensity. Overall distribution appearance is most consistent with underlying demyelinating disease, multiple sclerosis. Faint diffusion abnormality with associated enhancement about a few of these lesions is most consistent with active demyelination. No infratentorial lesions identified. No other evidence for acute infarct. No evidence for acute or chronic intracranial hemorrhage. No mass lesion, midline shift or mass effect. No hydrocephalus. Major dural sinuses are grossly patent. Pituitary gland mildly prominent with convex border superiorly, likely within normal limits for age. Vascular: Major intracranial vascular flow voids are maintained. Skull and upper cervical spine: Craniocervical junction normal. Visualized upper cervical spine unremarkable. Bone marrow signal intensity somewhat diffusely decreased on T1 weighted signal intensity, most commonly related to anemia, smoking, and obesity. Scalp soft tissues demonstrate no acute abnormality. Other: Retention cyst  noted within the right maxillary sinus. Scattered mucosal thickening within the ethmoidal air cells. Paranasal sinuses are otherwise clear. No mastoid effusion. Inner ear structures grossly normal. MRI ORBITS  FINDINGS Orbits: Globes are symmetric in size with normal appearance. Extra-ocular muscles symmetric and within normal limits. Lacrimal glands normal. Superior ophthalmic veins within normal limits. Left optic nerve is somewhat diffusely enlarged as compared to the right. Scattered T2 hyperintensity surrounds the optic nerve and is present within the nerve itself, suggesting edema. There is associated fuzzy perineural enhancement, with additional discontinue areas of enhancement within the nerve itself. Changes consistent with acute optic neuritis. Changes extend to near the level of the left orbital apex. Optic chiasm itself is normal in appearance. Right optic nerve is normal in appearance. Intraconal and extraconal fat well-maintained. Visualized sinuses: Mucous retention cyst right maxillary sinus with scattered mucosal thickening within the ethmoidal air cells. Paranasal sinuses are otherwise clear. Soft tissues: Periorbital soft tissues within normal limits. IMPRESSION: 1. Cerebral white matter disease in a distribution most consistent with demyelinating disease/multiple sclerosis. Mild enhancement about a few scattered lesions consistent with active demyelination. 2. Findings consistent with acute left optic neuritis as above. Electronically Signed   By: Rise Mu M.D.   On: 12/11/2016 23:39   Mr [place] ZO Contrast  Result Date: 12/11/2016 CLINICAL DATA:  Initial evaluation for acute blurry vision with pain behind left eye. EXAM: MRI HEAD AND ORBITS WITHOUT AND WITH CONTRAST TECHNIQUE: Multiplanar, multiecho pulse sequences of the brain and surrounding structures were obtained without and with intravenous contrast. Multiplanar, multiecho pulse sequences of the orbits and surrounding structures were obtained including fat saturation techniques, before and after intravenous contrast administration. CONTRAST:  10mL MULTIHANCE GADOBENATE DIMEGLUMINE 529 MG/ML IV SOLN COMPARISON:  None available.  FINDINGS: MRI HEAD FINDINGS Brain: Cerebral volume within normal limits for age. There are several scattered patchy T2/FLAIR hyperintense lesions involving predominantly in the periventricular white matter of both cerebral hemispheres, with a few additional small subcortical lesions present. Many of these lesions are oriented perpendicular to the lateral ventricles, and demonstrate precontrast T1 hypointense signal intensity. Overall distribution appearance is most consistent with underlying demyelinating disease, multiple sclerosis. Faint diffusion abnormality with associated enhancement about a few of these lesions is most consistent with active demyelination. No infratentorial lesions identified. No other evidence for acute infarct. No evidence for acute or chronic intracranial hemorrhage. No mass lesion, midline shift or mass effect. No hydrocephalus. Major dural sinuses are grossly patent. Pituitary gland mildly prominent with convex border superiorly, likely within normal limits for age. Vascular: Major intracranial vascular flow voids are maintained. Skull and upper cervical spine: Craniocervical junction normal. Visualized upper cervical spine unremarkable. Bone marrow signal intensity somewhat diffusely decreased on T1 weighted signal intensity, most commonly related to anemia, smoking, and obesity. Scalp soft tissues demonstrate no acute abnormality. Other: Retention cyst noted within the right maxillary sinus. Scattered mucosal thickening within the ethmoidal air cells. Paranasal sinuses are otherwise clear. No mastoid effusion. Inner ear structures grossly normal. MRI ORBITS FINDINGS Orbits: Globes are symmetric in size with normal appearance. Extra-ocular muscles symmetric and within normal limits. Lacrimal glands normal. Superior ophthalmic veins within normal limits. Left optic nerve is somewhat diffusely enlarged as compared to the right. Scattered T2 hyperintensity surrounds the optic nerve and is  present within the nerve itself, suggesting edema. There is associated fuzzy perineural enhancement, with additional discontinue areas of enhancement within the nerve itself. Changes consistent with acute optic neuritis. Changes extend to  near the level of the left orbital apex. Optic chiasm itself is normal in appearance. Right optic nerve is normal in appearance. Intraconal and extraconal fat well-maintained. Visualized sinuses: Mucous retention cyst right maxillary sinus with scattered mucosal thickening within the ethmoidal air cells. Paranasal sinuses are otherwise clear. Soft tissues: Periorbital soft tissues within normal limits. IMPRESSION: 1. Cerebral white matter disease in a distribution most consistent with demyelinating disease/multiple sclerosis. Mild enhancement about a few scattered lesions consistent with active demyelination. 2. Findings consistent with acute left optic neuritis as above. Electronically Signed   By: Rise Mu M.D.   On: 12/11/2016 23:39    Assessment/Plan 31 year old lady with probable demyelinating disease presenting with acute left optic neuritis.  Recommendations: 1. Solu-Medrol 1000 mg per day 3, followed by prednisone taper starting at 60 mg per day and tapering by 10 mg per day 2. Protonix 40 mg per day 3. Outpatient neurology follow-up for continued long-term management of probable demyelinating disease  C.R. Roseanne Reno, MD [facility] [phone]  12/12/2016, 2:08 AM

## 2016-12-12 NOTE — Care Management Note (Signed)
Case Management Note  Patient Details  Name: Brianna Sanders MRN: [id number] Date of Birth: 07/22/1986  Subjective/Objective:        Patient was admitted with optic neuritis and possible new MS diagnosis. Lives at home with roommate. Patient is currently listed as self pay. CM will follow for discharge needs pending patient's progress and physician orders.             Action/Plan:   Expected Discharge Date:                  Expected Discharge Plan:     In-House Referral:     Discharge planning Services     Post Acute Care Choice:    Choice offered to:     DME Arranged:    DME Agency:     HH Arranged:    HH Agency:     Status of Service:     If discussed at [employer] of Stay Meetings, dates discussed:    Additional Comments:  Anda Kraft, RN 12/12/2016, 10:54 AM

## 2016-12-13 DIAGNOSIS — H469 Unspecified optic neuritis: Principal | ICD-10-CM

## 2016-12-13 DIAGNOSIS — G379 Demyelinating disease of central nervous system, unspecified: Secondary | ICD-10-CM

## 2016-12-13 LAB — VITAMIN B12: VITAMIN B 12: 787 pg/mL (ref 180–914)

## 2016-12-13 LAB — GLUCOSE, CAPILLARY
Glucose-Capillary: 130 mg/dL — ABNORMAL HIGH (ref 65–99)
Glucose-Capillary: 146 mg/dL — ABNORMAL HIGH (ref 65–99)
Glucose-Capillary: 136 mg/dL — ABNORMAL HIGH (ref 65–99)

## 2016-12-13 LAB — COMPREHENSIVE METABOLIC PANEL
ALK PHOS: 47 U/L (ref 38–126)
ALT: 8 U/L — ABNORMAL LOW (ref 14–54)
ANION GAP: 9 (ref 5–15)
AST: 16 U/L (ref 15–41)
Albumin: 4 g/dL (ref 3.5–5.0)
BUN: 10 mg/dL (ref 6–20)
CALCIUM: 9.9 mg/dL (ref 8.9–10.3)
CO2: 23 mmol/L (ref 22–32)
Chloride: 109 mmol/L (ref 101–111)
Creatinine, Ser: 0.83 mg/dL (ref 0.44–1.00)
Glucose, Bld: 134 mg/dL — ABNORMAL HIGH (ref 65–99)
Potassium: 3.7 mmol/L (ref 3.5–5.1)
SODIUM: 141 mmol/L (ref 135–145)
TOTAL PROTEIN: 6.8 g/dL (ref 6.5–8.1)
Total Bilirubin: 0.2 mg/dL — ABNORMAL LOW (ref 0.3–1.2)

## 2016-12-13 LAB — MAGNESIUM: Magnesium: 2.3 mg/dL (ref 1.7–2.4)

## 2016-12-13 LAB — TSH: TSH: 0.224 u[IU]/mL — AB (ref 0.350–4.500)

## 2016-12-13 MED ORDER — SODIUM CHLORIDE 0.9 % IV SOLN
1000.0000 mg | Freq: Once | INTRAVENOUS | Status: AC
  Administered 2016-12-13: 1000 mg via INTRAVENOUS
  Filled 2016-12-13: qty 8

## 2016-12-13 MED ORDER — PREDNISONE 10 MG PO TABS: ORAL_TABLET | ORAL | 0 refills | Status: DC

## 2016-12-13 NOTE — Progress Notes (Signed)
Neurology Progress Note  Subjective: She reports that the pain behind her R eye is improved. She still notes some blurry vision but overall feels better. She has no new neurologic complaints. She notes some discomfort with her IV but otherwise ROS is unremarkable.   Current Meds:   Current Facility-Administered Medications:  .  enoxaparin (LOVENOX) injection 40 mg, 40 mg, Subcutaneous, Q24H, Jared M Gardner, DO, 40 mg at 12/12/16 1417 .  methylPREDNISolone sodium succinate (SOLU-MEDROL) 1,000 mg in sodium chloride 0.9 % 50 mL IVPB, 1,000 mg, Intravenous, Q24H, Hillary Bow, DO, 1,000 mg at 12/13/16 0029  Objective:  Temp:  [97.8 F (36.6 C)-98.3 F (36.8 C)] 98 F (36.7 C) (01/27 0918) Pulse Rate:  [77-106] 87 (01/27 0918) Resp:  [18-20] 20 (01/27 0918) BP: (103-118)/(65-84) 103/66 (01/27 0918) SpO2:  [98 %-100 %] 100 % (01/27 0918)  General: WDWN AA woman sitting up in bed in NAD. Alert, oriented x4. Speech is clear without dysarthria. Affect is bright. Comportment is normal.  HEENT: Neck is supple without lymphadenopathy. Mucous membranes are moist and the oropharynx is clear. Sclerae are anicteric. There is no conjunctival injection.  CV: Regular, no murmur. Carotid pulses are 2+ and symmetric with no bruits. Distal pulses 2+ and symmetric.  Lungs: CTAB  Extremities: No C/C/E. Neuro: MS: As noted above. No aphasia.  CN: Pupils are equal and reactive from 3-->2 mm bilaterally. EOMI, no nystagmus. Facial sensation is intact to light touch. Face is symmetric at rest with normal strength and mobility. Hearing is intact to conversational voice. Voice is normal in tone and quality. Palate elevates symmetrically. Uvula is midline. Bilateral SCM and trapezii are 5/5. Tongue is midline with normal bulk and mobility.  Motor: Normal bulk, tone, and strength throughout. No pronator drift. No tremor or other abnormal movements are observed.  Sensation: Intact to light touch.  DTRs: 2+,  symmetric.  Coordination: Finger-to-nose without dysmetria bilaterally.     Labs: Lab Results  Component Value Date   WBC 5.9 12/11/2016   HGB 13.3 12/11/2016   HCT 39.0 12/11/2016   PLT 238 12/11/2016   GLUCOSE 134 (H) 12/13/2016   ALT 8 (L) 12/13/2016   AST 16 12/13/2016   NA 141 12/13/2016   K 3.7 12/13/2016   CL 109 12/13/2016   CREATININE 0.83 12/13/2016   BUN 10 12/13/2016   CO2 23 12/13/2016   TSH 0.224 (L) 12/13/2016   CBC Latest Ref Rng & Units 12/11/2016 12/11/2016  WBC 4.0 - 10.5 K/uL - 5.9  Hemoglobin 12.0 - 15.0 g/dL 16.1 09.6  Hematocrit 04.5 - 46.0 % 39.0 37.1  Platelets 150 - 400 K/uL - 238    No results found for: HGBA1C Lab Results  Component Value Date   ALT 8 (L) 12/13/2016   AST 16 12/13/2016   ALKPHOS 47 12/13/2016   BILITOT 0.2 (L) 12/13/2016   B12 787 TSH 0.224  ACE pending HIV pending RPR pending Lyme titers pending ANA pending   Radiology:  I have personally and independently reviewed the MRI brain with and without contrast from 12/11/16. This shows several scattered areas of hyperintensity in the bihemispheric white matter. These are periventricular and subcortical in distribution. About four of these show slight enhancement. The scan is otherwise unremarkable.   I have personally and independently reviewed th MRI of the orbits with and without contrast from 12/11/16. This shows hyperintensity of the L optic nerve with associated contrast enhancement.    A/P:   1. L optic  neuritis: This is acute. I suspect she likely has MS and this represents her CIS. She is on day #3/3 of IV solumedrol. Today's dose can be given early and she could be discharged home with prednisone taper (start with 60 and taper over 7-10 days). F/u with ophtho as previously arranged.   2. Possible MS: Given age, optic neuritis, and MRI brain, this seems the most likely diagnosis. She reports that she has an uncle with sarcoidosis, an aunt with what sounds like possible  scleroderma, and a distant cousin with SLE. Agree with testing for mimics, will add ACE level. B12 normal, everything else is still pending. Doubt NMO given MRI brain findings. Complete steroids. Recommend that she follow up with Dr. Despina Arias at [facility].   From the neuro perspective, she is OK for discharge from home. She is hoping she can still go on a cruise to the [place] tomorrow that she has had planned for some time. Her optic neuritis would not preclude her going on a cruise. She was cautioned that fatigue, stress, and heat may result in recrudescence of her visual deficits, however. This would be temporary.   This was discussed with the patient and her sister. She was given the opportunity to ask any questions and these were addressed to her satisfaction.   Rhona Leavens, MD [facility]

## 2016-12-13 NOTE — Discharge Summary (Signed)
Discharge Summary  Brianna Sanders VOH:[id number] DOB: [birth year]  PCP: Aurelio Brash, NP  Admit date: 12/11/2016 Discharge date: 12/13/2016  Time spent: <57mins  Recommendations for Outpatient Follow-up:  1. F/u with PMD within a week  for hospital discharge follow up, repeat cbc/bmp at follow up. pmd to repeat tsh at hospital follow up, pmd to follow up on final lab tests that are pending at time of discharge. 2. F/u with neurology for possible MS. 3. F/u with ophthalmology  Discharge Diagnoses:  Active Hospital Problems   Diagnosis Date Noted  . Optic neuritis 12/12/2016  . Demyelinating changes in brain [facility]) 12/12/2016    Resolved Hospital Problems   Diagnosis Date Noted Date Resolved  No resolved problems to display.    Discharge Condition: stable  Diet recommendation: regular diet  Filed Weights   12/11/16 1702 12/12/16 0254  Weight: 56.7 kg (125 lb) 55.3 kg (122 lb)    History of present illness:  PCP: Aurelio Brash, NP Chief Complaint:     Chief Complaint  Patient presents with  . Blurred Vision    HPI: Brianna Sanders is a 31 y.o. female with medical history significant of Previously healthy.  Patient presents to the ED with c/o blurred vision and pain behind L eye.  Patient is sent in by opthomology after their physical exam was worrisome for optic neuritis!  Symptoms onset 2 weeks ago, progressively worsening.  Nothing makes symptoms better or worse.  As mentioned above saw optho today who sent her in to ED rule out optic neuritis.  ED Course: MRI findings c/w L optic neuritis, and multiple sclerosis.    Hospital Course:  Principal Problem:   Optic neuritis Active Problems:   Demyelinating changes in brain [facility])   Multiple sclerosis/left optic neuritis:   Neurology recommendation as in below: "1. L optic neuritis: This is acute. I suspect she likely has MS and this represents her CIS. She is on day #3/3 of IV  solumedrol. Today's dose can be given early and she could be discharged home with prednisone taper (start with 60 and taper over 7-10 days). F/u with ophtho as previously arranged.   2. Possible MS: Given age, optic neuritis, and MRI brain, this seems the most likely diagnosis. She reports that she has an uncle with sarcoidosis, an aunt with what sounds like possible scleroderma, and a distant cousin with SLE. Agree with testing for mimics, will add ACE level. B12 normal, everything else is still pending. Doubt NMO given MRI brain findings. Complete steroids. Recommend that she follow up with Dr. Despina Arias at [facility].   From the neuro perspective, she is OK for discharge from home. She is hoping she can still go on a cruise to the [place] tomorrow that she has had planned for some time. Her optic neuritis would not preclude her going on a cruise. She was cautioned that fatigue, stress, and heat may result in recrudescence of her visual deficits, however. This would be temporary. "    she received high dose steroids 1000mg  x3 doses in the hospital, blurry vision has improved. She is discharged on prednisone taper. Outpatient follow up with pmd/ophthalmology/neurology  Code Status: full  Family Communication: patient and sister at bedside  Disposition Plan: home on 1/27   Consultants:  neurology  Procedures:  none  Antibiotics:  none   Discharge Exam: BP 103/66 (BP Location: Left Arm)   Pulse 87   Temp 98 F (36.7 C) (Oral)   Resp  20   Ht 5\' 5"  (1.651 m)   Wt 55.3 kg (122 lb)   LMP 12/10/2016   SpO2 100%   BMI 20.30 kg/m    General:  NAD  Cardiovascular: RRR  Respiratory: CTABL  Abdomen: Soft/ND/NT, positive BS  Musculoskeletal: No Edema  Neuro: blurry vision left eye has improved, no weakness   Discharge Instructions You were cared for by a hospitalist during your hospital stay. If you have any questions about your  discharge medications or the care you received while you were in the hospital after you are discharged, you can call the unit and asked to speak with the hospitalist on call if the hospitalist that took care of you is not available. Once you are discharged, your primary care physician will handle any further medical issues. Please note that NO REFILLS for any discharge medications will be authorized once you are discharged, as it is imperative that you return to your primary care physician (or establish a relationship with a primary care physician if you do not have one) for your aftercare needs so that they can reassess your need for medications and monitor your lab values.  Discharge Instructions    Diet general    Complete by:  As directed    Increase activity slowly    Complete by:  As directed      Allergies as of 12/13/2016   No Known Allergies     Medication List    TAKE these medications   ibuprofen 800 MG tablet Commonly known as:  ADVIL,MOTRIN Take 800 mg by mouth every 8 (eight) hours as needed for cramping.   prednisoLONE acetate 1 % ophthalmic suspension Commonly known as:  PRED FORTE Place 1 drop into the left eye every 6 (six) hours.   predniSONE 10 MG tablet Commonly known as:  DELTASONE Label  & dispense according to the schedule below. 6 Pills PO on day one, 5 Pills on day two , 4 Pills PO on day three, 3 Pills PO on day four, 2 Pills PO on day five , 1 Pills PO on day six, 1/2 Pill  PO on day seven then STOP.      No Known Allergies Follow-up Information    SATER,RICHARD A, MD Follow up in 1 month(s).   Specialty:  Neurology Why:  for possible MS Contact information: [address] [place] [address] [phone]        Hope Pigeon B, DO Follow up in 3 week(s).   Specialty:  Internal Medicine Why:  hospital discharge follow up pmd to repeat thyroid function test at follow up, pmd to follow up on pending lab tests that was done in the  hospital. Contact information: [address] [place] [address] [phone]        follow with your ophthalmologist as scheduled Follow up.            The results of significant diagnostics from this hospitalization (including imaging, microbiology, ancillary and laboratory) are listed below for reference.    Significant Diagnostic Studies: Mr Laqueta Jean And Wo Contrast  Result Date: 12/11/2016 CLINICAL DATA:  Initial evaluation for acute blurry vision with pain behind left eye. EXAM: MRI HEAD AND ORBITS WITHOUT AND WITH CONTRAST TECHNIQUE: Multiplanar, multiecho pulse sequences of the brain and surrounding structures were obtained without and with intravenous contrast. Multiplanar, multiecho pulse sequences of the orbits and surrounding structures were obtained including fat saturation techniques, before and after intravenous contrast administration. CONTRAST:  10mL MULTIHANCE GADOBENATE DIMEGLUMINE 529 MG/ML IV SOLN COMPARISON:  None available. FINDINGS: MRI HEAD FINDINGS Brain: Cerebral volume within normal limits for age. There are several scattered patchy T2/FLAIR hyperintense lesions involving predominantly in the periventricular white matter of both cerebral hemispheres, with a few additional small subcortical lesions present. Many of these lesions are oriented perpendicular to the lateral ventricles, and demonstrate precontrast T1 hypointense signal intensity. Overall distribution appearance is most consistent with underlying demyelinating disease, multiple sclerosis. Faint diffusion abnormality with associated enhancement about a few of these lesions is most consistent with active demyelination. No infratentorial lesions identified. No other evidence for acute infarct. No evidence for acute or chronic intracranial hemorrhage. No mass lesion, midline shift or mass effect. No hydrocephalus. Major dural sinuses are grossly patent. Pituitary gland mildly prominent with  convex border superiorly, likely within normal limits for age. Vascular: Major intracranial vascular flow voids are maintained. Skull and upper cervical spine: Craniocervical junction normal. Visualized upper cervical spine unremarkable. Bone marrow signal intensity somewhat diffusely decreased on T1 weighted signal intensity, most commonly related to anemia, smoking, and obesity. Scalp soft tissues demonstrate no acute abnormality. Other: Retention cyst noted within the right maxillary sinus. Scattered mucosal thickening within the ethmoidal air cells. Paranasal sinuses are otherwise clear. No mastoid effusion. Inner ear structures grossly normal. MRI ORBITS FINDINGS Orbits: Globes are symmetric in size with normal appearance. Extra-ocular muscles symmetric and within normal limits. Lacrimal glands normal. Superior ophthalmic veins within normal limits. Left optic nerve is somewhat diffusely enlarged as compared to the right. Scattered T2 hyperintensity surrounds the optic nerve and is present within the nerve itself, suggesting edema. There is associated fuzzy perineural enhancement, with additional discontinue areas of enhancement within the nerve itself. Changes consistent with acute optic neuritis. Changes extend to near the level of the left orbital apex. Optic chiasm itself is normal in appearance. Right optic nerve is normal in appearance. Intraconal and extraconal fat well-maintained. Visualized sinuses: Mucous retention cyst right maxillary sinus with scattered mucosal thickening within the ethmoidal air cells. Paranasal sinuses are otherwise clear. Soft tissues: Periorbital soft tissues within normal limits. IMPRESSION: 1. Cerebral white matter disease in a distribution most consistent with demyelinating disease/multiple sclerosis. Mild enhancement about a few scattered lesions consistent with active demyelination. 2. Findings consistent with acute left optic neuritis as above. Electronically Signed   By:  Rise Mu M.D.   On: 12/11/2016 23:39   Mr Rockwell [place] ZO Contrast  Result Date: 12/11/2016 CLINICAL DATA:  Initial evaluation for acute blurry vision with pain behind left eye. EXAM: MRI HEAD AND ORBITS WITHOUT AND WITH CONTRAST TECHNIQUE: Multiplanar, multiecho pulse sequences of the brain and surrounding structures were obtained without and with intravenous contrast. Multiplanar, multiecho pulse sequences of the orbits and surrounding structures were obtained including fat saturation techniques, before and after intravenous contrast administration. CONTRAST:  10mL MULTIHANCE GADOBENATE DIMEGLUMINE 529 MG/ML IV SOLN COMPARISON:  None available. FINDINGS: MRI HEAD FINDINGS Brain: Cerebral volume within normal limits for age. There are several scattered patchy T2/FLAIR hyperintense lesions involving predominantly in the periventricular white matter of both cerebral hemispheres, with a few additional small subcortical lesions present. Many of these lesions are oriented perpendicular to the lateral ventricles, and demonstrate precontrast T1 hypointense signal intensity. Overall distribution appearance is most consistent with underlying demyelinating disease, multiple sclerosis. Faint diffusion abnormality with associated enhancement about a few of these lesions is most consistent with active demyelination. No infratentorial lesions identified. No other evidence for acute infarct.  No evidence for acute or chronic intracranial hemorrhage. No mass lesion, midline shift or mass effect. No hydrocephalus. Major dural sinuses are grossly patent. Pituitary gland mildly prominent with convex border superiorly, likely within normal limits for age. Vascular: Major intracranial vascular flow voids are maintained. Skull and upper cervical spine: Craniocervical junction normal. Visualized upper cervical spine unremarkable. Bone marrow signal intensity somewhat diffusely decreased on T1 weighted signal intensity, most  commonly related to anemia, smoking, and obesity. Scalp soft tissues demonstrate no acute abnormality. Other: Retention cyst noted within the right maxillary sinus. Scattered mucosal thickening within the ethmoidal air cells. Paranasal sinuses are otherwise clear. No mastoid effusion. Inner ear structures grossly normal. MRI ORBITS FINDINGS Orbits: Globes are symmetric in size with normal appearance. Extra-ocular muscles symmetric and within normal limits. Lacrimal glands normal. Superior ophthalmic veins within normal limits. Left optic nerve is somewhat diffusely enlarged as compared to the right. Scattered T2 hyperintensity surrounds the optic nerve and is present within the nerve itself, suggesting edema. There is associated fuzzy perineural enhancement, with additional discontinue areas of enhancement within the nerve itself. Changes consistent with acute optic neuritis. Changes extend to near the level of the left orbital apex. Optic chiasm itself is normal in appearance. Right optic nerve is normal in appearance. Intraconal and extraconal fat well-maintained. Visualized sinuses: Mucous retention cyst right maxillary sinus with scattered mucosal thickening within the ethmoidal air cells. Paranasal sinuses are otherwise clear. Soft tissues: Periorbital soft tissues within normal limits. IMPRESSION: 1. Cerebral white matter disease in a distribution most consistent with demyelinating disease/multiple sclerosis. Mild enhancement about a few scattered lesions consistent with active demyelination. 2. Findings consistent with acute left optic neuritis as above. Electronically Signed   By: Rise Mu M.D.   On: 12/11/2016 23:39    Microbiology: No results found for this or any previous visit (from the past 240 hour(s)).   Labs: Basic Metabolic Panel:  Recent Labs Lab 12/11/16 2151 12/13/16 0757  NA 139 141  K 3.3* 3.7  CL 105 109  CO2  --  23  GLUCOSE 92 134*  BUN 7 10  CREATININE 0.80  0.83  CALCIUM  --  9.9  MG  --  2.3   Liver Function Tests:  Recent Labs Lab 12/13/16 0757  AST 16  ALT 8*  ALKPHOS 47  BILITOT 0.2*  PROT 6.8  ALBUMIN 4.0   No results for input(s): LIPASE, AMYLASE in the last 168 hours. No results for input(s): AMMONIA in the last 168 hours. CBC:  Recent Labs Lab 12/11/16 2149 12/11/16 2151  WBC 5.9  --   NEUTROABS 2.6  --   HGB 12.4 13.3  HCT 37.1 39.0  MCV 85.5  --   PLT 238  --    Cardiac Enzymes: No results for input(s): CKTOTAL, CKMB, CKMBINDEX, TROPONINI in the last 168 hours. BNP: BNP (last 3 results) No results for input(s): BNP in the last 8760 hours.  ProBNP (last 3 results) No results for input(s): PROBNP in the last 8760 hours.  CBG:  Recent Labs Lab 12/12/16 1350 12/12/16 1625 12/12/16 2131 12/13/16 0627 12/13/16 1139  GLUCAP 163* 134* 154* 130* 146*       Signed:  Jacques Willingham MD, PhD  [facility] 12/13/2016, 2:08 PM

## 2016-12-13 NOTE — Progress Notes (Signed)
Almost done w/IV solumedrol, home soon.

## 2016-12-15 LAB — GLUCOSE, CAPILLARY: Glucose-Capillary: 146 mg/dL — ABNORMAL HIGH (ref 65–99)

## 2016-12-15 LAB — ANGIOTENSIN CONVERTING ENZYME: Angiotensin-Converting Enzyme: 40 U/L (ref 14–82)

## 2016-12-18 LAB — ANTINUCLEAR ANTIBODIES, IFA: ANTINUCLEAR ANTIBODIES, IFA: NEGATIVE

## 2016-12-18 LAB — HIV ANTIBODY (ROUTINE TESTING W REFLEX): HIV Screen 4th Generation wRfx: NONREACTIVE

## 2016-12-18 LAB — B. BURGDORFI ANTIBODIES: B burgdorferi Ab IgG+IgM: <0.91 {ISR} (ref 0.00–0.90)

## 2016-12-18 LAB — RPR: RPR Ser Ql: NONREACTIVE

## 2017-01-12 ENCOUNTER — Encounter: Payer: Self-pay | Admitting: Neurology

## 2017-01-12 ENCOUNTER — Ambulatory Visit (INDEPENDENT_AMBULATORY_CARE_PROVIDER_SITE_OTHER): Payer: BLUE CROSS/BLUE SHIELD | Admitting: Neurology

## 2017-01-12 VITALS — BP 100/68 | HR 91 | Resp 16 | Ht 65.0 in | Wt 129.0 lb

## 2017-01-12 DIAGNOSIS — H469 Unspecified optic neuritis: Secondary | ICD-10-CM

## 2017-01-12 DIAGNOSIS — Z79899 Other long term (current) drug therapy: Secondary | ICD-10-CM

## 2017-01-12 DIAGNOSIS — E559 Vitamin D deficiency, unspecified: Secondary | ICD-10-CM

## 2017-01-12 DIAGNOSIS — G35 Multiple sclerosis: Secondary | ICD-10-CM

## 2017-01-12 DIAGNOSIS — R7989 Other specified abnormal findings of blood chemistry: Secondary | ICD-10-CM | POA: Insufficient documentation

## 2017-01-12 NOTE — Progress Notes (Signed)
[facility]  PATIENT: Brianna Sanders DOB: July 15, 1986  REFERRING DOCTOR OR PCP:  Hope Pigeon SOURCE: patient, notes from ED, admission and PCP, MRI and lab reports, MRI images on PACS  _________________________________   HISTORICAL  CHIEF COMPLAINT:  Chief Complaint  Patient presents with  . Abnormal MRI    Brianna Sanders is here with her sister Hulen Luster to discuss mri results. Sts. about 6 weeks ago, she began having blurry vision, left eye pain.  Seen at [facility] ER--had an abnormal MRI brain, suspicious for MS, then admitted and given 3 days of IV steroids.  D/C  with oral steroids and steroid eye drops and told to f/u with Neuro.  Has not had an LP.  Sts. vision pretty much back to normal./fim    HISTORY OF PRESENT ILLNESS:  I had the pleasure seeing you patient, Brianna Sanders, at [facility] for a neurologic consultation regarding her recent optic neuritis and abnormal brain MRI.   She is a 31 year old woman who was diagnosed with optic neuritis  On 11/30/2016, she had the onset of blurred vision and pain behind the left eye. She saw one ophthalmologist and told she had iritis and she was prescribed eye drops.    She saw another ophthalmologist 12/11/2016 and was referred to the [facility] emergency room for further management of optic neuritis and further diagnostic workup.    An MRI of the brain showed multiple lesions consistent with MS and she was admitted for IV Solu-Medrol.  ANA, Lyme, RPR and HIV were negative. TSH was mildly low.  After the steroids, she felt that the vision improved quite a bit though it is still not back to baseline.    She does not recall any other episode of neurologic symptoms lasting over a day.  Gait/strength/sensation: She denies any difficulty with her gait or balance. She does not note any numbness or weakness in the arms or legs. She feels that she could climb a ladder or stairs without  difficulty.  Bladder:   He denies any difficulty with urinary frequency, urgency, incontinence or hesitancy. She does not get urinary tract infections. There are no bowel issues.  Fatigue/sleep: She has not noted significant problems with fatigue. She sleeps well at night.  Mood/cognition: She denies depression or anxiety. She has not noted difficulty with memory or other cognitive function.  I reviewed the MRI of the brain and orbits performed 12/11/2016. The MRI of the orbits shows enhancement of the left optic nerve. The nerve is also hyperintense on 10 T2 weighted images. The MRI of the brain shows increased signal on diffusion-weighted images with in the optic nerve. Multiple T2/FLAIR hyperintense foci, mostly in the periventricular white matter with some of the foci are radially oriented to the ventricles. One left juxtacortical foci is also seen.   Four or five of the foci enhanced, including 2 of periventricular foci and the one left juxtacortical focus.    I reviewed laboratory tests at the hospital and from her primary care doctor. Vitamin D was low August 2017 and she was supplemented. TSH was normal August 2017 but was mildly low at her recent hospitalization.  One of her Aunts has Raynauds syndrome and sarcoidosis  REVIEW OF SYSTEMS: Constitutional: No fevers, chills, sweats, or change in appetite Eyes: see above. Ear, nose and throat: No hearing loss, ear pain, nasal congestion, sore throat Cardiovascular: No chest pain.   She has had recent palpitations (recent normal Holter) Respiratory: No shortness of breath  at rest or with exertion.   No wheezes GastrointestinaI: No nausea, vomiting, diarrhea, abdominal pain, fecal incontinence Genitourinary: No dysuria, urinary retention or frequency.  No nocturia. Musculoskeletal: No neck pain, back pain Integumentary: No rash, pruritus, skin lesions Neurological: as above Psychiatric: No depression at this time.  No anxiety Endocrine:  No palpitations, diaphoresis, change in appetite, change in weigh or increased thirst Hematologic/Lymphatic: No anemia, purpura, petechiae. Allergic/Immunologic: No itchy/runny eyes, nasal congestion, recent allergic reactions, rashes  ALLERGIES: No Known Allergies  HOME MEDICATIONS:  Current Outpatient Prescriptions:  .  ibuprofen (ADVIL,MOTRIN) 800 MG tablet, Take 800 mg by mouth every 8 (eight) hours as needed for cramping., Disp: , Rfl:  .  Vitamin D, Ergocalciferol, (DRISDOL) 50000 units CAPS capsule, , Disp: , Rfl: 0  PAST MEDICAL HISTORY: Past Medical History:  Diagnosis Date  . Palpitations   . Vision abnormalities     PAST SURGICAL HISTORY: No past surgical history on file.  FAMILY HISTORY: Family History  Problem Relation Age of Onset  . Healthy Mother   . Healthy Father     SOCIAL HISTORY:  Social History   Social History  . Marital status: Unknown    Spouse name: N/A  . Number of children: N/A  . Years of education: N/A   Occupational History  . Not on file.   Social History Main Topics  . Smoking status: Former Smoker    Quit date: 01/14/2012  . Smokeless tobacco: Never Used  . Alcohol use Yes  . Drug use: No  . Sexual activity: Yes    Birth control/ protection: Condom   Other Topics Concern  . Not on file   Social History Narrative  . No narrative on file     PHYSICAL EXAM  Vitals:   01/12/17 1024  BP: 100/68  Pulse: 91  Resp: 16  Weight: 129 lb (58.5 kg)  Height: 5\' 5"  (1.651 m)    Body mass index is 21.47 kg/m.   General: The patient is well-developed and well-nourished and in no acute distress  Eyes:  Funduscopic exam shows normal optic discs and retinal vessels.  Neck: The neck is supple, no carotid bruits are noted.  The neck is nontender.  Cardiovascular: The heart has a regular rate and rhythm with a normal S1 and S2. There were no murmurs, gallops or rubs. Lungs are clear to auscultation.  Skin: Extremities are  without significant edema.  Musculoskeletal:  Back is nontender  Neurologic Exam  Mental status: The patient is alert and oriented x 3 at the time of the examination. The patient has apparent normal recent and remote memory, with an apparently normal attention span and concentration ability.   Speech is normal.  Cranial nerves: Extraocular movements are full. She has a 2+ left APD,  OS  20/50   OD 20/30 with glasses.  Visual fields are full.  Facial symmetry is present. There is good facial sensation to soft touch bilaterally.Facial strength is normal.  Trapezius and sternocleidomastoid strength is normal. No dysarthria is noted.  The tongue is midline, and the patient has symmetric elevation of the soft palate. No obvious hearing deficits are noted.  Motor:  Muscle bulk is normal.   Tone is normal. Strength is  5 / 5 in all 4 extremities.   Sensory: Sensory testing is intact to pinprick, soft touch and vibration sensation in all 4 extremities.  Coordination: Cerebellar testing reveals good finger-nose-finger and heel-to-shin bilaterally.  Gait and station: Station is normal.  Gait is normal. Tandem gait is normal. Romberg is negative.   Reflexes: Deep tendon reflexes are symmetric and normal bilaterally (2 in arms/ankles and 3 at knees).   Plantar responses are flexor.    DIAGNOSTIC DATA (LABS, IMAGING, TESTING) - I reviewed patient records, labs, notes, testing and imaging myself where available.  Lab Results  Component Value Date   WBC 5.9 12/11/2016   HGB 13.3 12/11/2016   HCT 39.0 12/11/2016   MCV 85.5 12/11/2016   PLT 238 12/11/2016      Component Value Date/Time   NA 141 12/13/2016 0757   K 3.7 12/13/2016 0757   CL 109 12/13/2016 0757   CO2 23 12/13/2016 0757   GLUCOSE 134 (H) 12/13/2016 0757   BUN 10 12/13/2016 0757   CREATININE 0.83 12/13/2016 0757   CALCIUM 9.9 12/13/2016 0757   PROT 6.8 12/13/2016 0757   ALBUMIN 4.0 12/13/2016 0757   AST 16 12/13/2016 0757    ALT 8 (L) 12/13/2016 0757   ALKPHOS 47 12/13/2016 0757   BILITOT 0.2 (L) 12/13/2016 0757   GFRNONAA >60 12/13/2016 0757   GFRAA >60 12/13/2016 0757   No results found for: CHOL, HDL, LDLCALC, LDLDIRECT, TRIG, CHOLHDL No results found for: WUJW1X Lab Results  Component Value Date   VITAMINB12 787 12/13/2016   Lab Results  Component Value Date   TSH 0.224 (L) 12/13/2016       ASSESSMENT AND PLAN    Multiple sclerosis (HCC) - Plan: Quantiferon tb gold assay (blood), Hepatitis B core antibody, total, Hepatitis B surface antigen, Hepatitis B surface antibody, TSH, T4, T3, Stratify JCV Antibody Test (Quest)  Optic neuritis - Plan: Pan-ANCA  High risk medication use - Plan: Quantiferon tb gold assay (blood), Hepatitis B core antibody, total, Hepatitis B surface antigen, Hepatitis B surface antibody, TSH, T4, T3, Stratify JCV Antibody Test (Quest)  Low vitamin D level   In summary, Ms. Durnin is a 31 year old woman with left optic neuritis last month. The MRI of the brain shows multiple T2/FLAIR hyperintense foci, many in the periventricular white matter in a pattern consistent with MS. 4 of the lesions enhance after contrast assist it with more acute foci. The optic nerve also enhances. The combination of her symptoms and MRI of is very consistent with the diagnosis of multiple sclerosis.  Besides the visual changes, her physical examination is otherwise normal.   I had a long conversation with Stela about the diagnosis of MS and treatment options.   I would recommend that we start a disease modifying therapy. She has trouble swallowing pills and prefers another administration option. She feels she could give herself a shot but would not want to do frequent administrations. We went over the pros and cons of several agents including Plegridy, Tysabri and ocrelizumab. Due to the stronger efficacy, she was more interested in ocrelizumab or Tysabri. We will check her for JCV antibody,  hepatitis B and TB to make sure that one of these options would not be contraindicated. Additionally, her TSH was reduced when she was in the hospital and I will recheck this as well as a T3 and T4. I will also check a pan-ANCA to make sure that she does not have polyarteritis nodosa which can mimic MS.   Vitamin D was low and she is encouraged to take the supplements as prescribed and to start taking 5000 units OTC daily when she runs out of the higher dose weekly supplement.  We will call her with the results  of the lab work and set her up for one of these 2 medications. We'll set up her infusions. She will come back to see me for a regular visit in 2-3 months but call sooner if she has new or worsening neurologic symptoms.  Thank you for asking me to see Naelani for a neurologic consultation. Please let me know if I can be of further assistance with her or other patients in the future.   90 minutes face-to-face evaluation with greater than one half of the time counseling or coordinating care about her new diagnosis of multiple sclerosis, prognosis, relapse symptoms and treatment options.   Richard A. Epimenio Foot, MD, PhD 01/12/2017, 3:42 PM Certified in Neurology, Clinical Neurophysiology, Sleep Medicine, Pain Medicine and Neuroimaging  [facility] [address], [address] [place], [place] [address] [phone]

## 2017-01-13 LAB — T4: T4 TOTAL: 7.6 ug/dL (ref 4.5–12.0)

## 2017-01-13 LAB — PAN-ANCA
ANCA Proteinase 3: <3.5 U/mL (ref 0.0–3.5)
P-ANCA: <1:20 {titer}

## 2017-01-13 LAB — HEPATITIS B CORE ANTIBODY, TOTAL: HEP B C TOTAL AB: NEGATIVE

## 2017-01-13 LAB — HEPATITIS B SURFACE ANTIBODY,QUALITATIVE: Hep B Surface Ab, Qual: REACTIVE

## 2017-01-13 LAB — TSH: TSH: 1.25 u[IU]/mL (ref 0.450–4.500)

## 2017-01-13 LAB — HEPATITIS B SURFACE ANTIGEN: HEP B S AG: NEGATIVE

## 2017-01-13 LAB — T3: T3, Total: 110 ng/dL (ref 71–180)

## 2017-01-15 LAB — QUANTIFERON IN TUBE
QFT TB AG MINUS NIL VALUE: 0 IU/mL
QUANTIFERON MITOGEN VALUE: 5.31 IU/mL
QUANTIFERON NIL VALUE: 0.02 [IU]/mL
QUANTIFERON TB AG VALUE: 0.02 [IU]/mL
QUANTIFERON TB GOLD: NEGATIVE

## 2017-01-15 LAB — QUANTIFERON TB GOLD ASSAY (BLOOD)

## 2017-01-19 ENCOUNTER — Telehealth: Payer: Self-pay | Admitting: Neurology

## 2017-01-19 NOTE — Telephone Encounter (Signed)
Patient called office in reference to lab results.  Results have been put on my chart, but patient has questions.  Please call

## 2017-01-20 ENCOUNTER — Telehealth: Payer: Self-pay | Admitting: *Deleted

## 2017-01-20 ENCOUNTER — Encounter: Payer: Self-pay | Admitting: *Deleted

## 2017-01-20 NOTE — Telephone Encounter (Signed)
error/fim. 

## 2017-01-20 NOTE — Telephone Encounter (Signed)
Patient called office in reference to receiving lab results.  Please call

## 2017-01-20 NOTE — Telephone Encounter (Signed)
I have spoken with Brianna Sanders this afternoon.  JCV ab is negative.  She is trying to decide between [place].  RAS has reviewed both meds with her. Sts. will call tomorrow with a decision/fim

## 2017-01-21 NOTE — Telephone Encounter (Signed)
I have spoken with Brianna Sanders this afternoon and advised that RAS is agreeable with Ocrelizumab.  SRF completed and given to [facility] in the infusion suite/fim

## 2017-01-21 NOTE — Telephone Encounter (Signed)
Patient called office to notify nurse she would like to start Ocrelizumab.  Please call

## 2017-01-27 ENCOUNTER — Telehealth: Payer: Self-pay | Admitting: *Deleted

## 2017-01-27 NOTE — Telephone Encounter (Signed)
Extended, pleasant conversation with pt. after receiving message from Beau to call  her.  She sts. she has had intermittent h/a onset 01-25-17.  Was worse on Sunday, better but still present now. She doesn't feel vision has quite returned to baseline, but is not double/blurry, and no eye pain. Speech is clear and deliberate.  She has not taken any meds for h/a.  She will try otc Ibuprofen/h/a, and call back if this does not help, or if she has visual changes.  Ocrelizumab srf was completed end of Feb. and given to [place] in the infusion suite.  I have reassured pt. that it does take about 6 weeks to be started on Ocrevus--I will check with Inetta Fermo tomorrow to see if everything is going smoothly so far.  Since pt. is new dx, has not been on prior dmt, RAS may need to do a peer to peer to get Ocrevus approved. He has documented that pt. has difficulty swallowing pills, is not able to self inject.  Ocrelizumab vs Tysabri were the meds he discussed with her.  She is JCV ab negative.  Will update pt. at some point this week/fim

## 2017-01-30 NOTE — Telephone Encounter (Signed)
I have spoken with Lorriann this morning and let her know that I have spoken with Inetta Fermo in the infusion suite--all info  has been submitted to [facility] and [place].  She should expect a call from both [place] and [facility], from either 800 or out of state area codes, so be sure to answer her phone.  She verbalized understanding of same/fim

## 2017-02-25 ENCOUNTER — Encounter: Payer: Self-pay | Admitting: Neurology

## 2017-03-02 ENCOUNTER — Encounter: Payer: Self-pay | Admitting: Neurology

## 2017-03-03 ENCOUNTER — Telehealth: Payer: Self-pay | Admitting: Neurology

## 2017-03-03 NOTE — Telephone Encounter (Signed)
Message printed and placed in Brianna Sanders's in-box in the infusion suite/fim 

## 2017-03-03 NOTE — Telephone Encounter (Signed)
Patient has an infusion sch for tomorrow and needs to reschedule. Best call back [phone]

## 2017-03-18 ENCOUNTER — Ambulatory Visit (INDEPENDENT_AMBULATORY_CARE_PROVIDER_SITE_OTHER): Payer: BLUE CROSS/BLUE SHIELD | Admitting: Neurology

## 2017-03-18 ENCOUNTER — Encounter: Payer: Self-pay | Admitting: Neurology

## 2017-03-18 VITALS — BP 119/80 | HR 103 | Resp 4 | Ht 65.0 in | Wt 137.5 lb

## 2017-03-18 DIAGNOSIS — J3089 Other allergic rhinitis: Secondary | ICD-10-CM

## 2017-03-18 DIAGNOSIS — H469 Unspecified optic neuritis: Secondary | ICD-10-CM | POA: Diagnosis not present

## 2017-03-18 DIAGNOSIS — G35 Multiple sclerosis: Secondary | ICD-10-CM

## 2017-03-18 DIAGNOSIS — Z79899 Other long term (current) drug therapy: Secondary | ICD-10-CM | POA: Diagnosis not present

## 2017-03-18 DIAGNOSIS — G35D Multiple sclerosis, unspecified: Secondary | ICD-10-CM

## 2017-03-18 DIAGNOSIS — J302 Other seasonal allergic rhinitis: Secondary | ICD-10-CM | POA: Insufficient documentation

## 2017-03-18 MED ORDER — LEVOCETIRIZINE DIHYDROCHLORIDE 5 MG PO TABS: 5.0000 mg | ORAL_TABLET | Freq: Every evening | ORAL | 3 refills | Status: DC

## 2017-03-18 MED ORDER — ROPINIROLE HCL 0.5 MG PO TABS: ORAL_TABLET | ORAL | 5 refills | Status: DC

## 2017-03-18 NOTE — Progress Notes (Signed)
[facility]  PATIENT: Brianna Sanders DOB: 02-Jan-1986  REFERRING DOCTOR OR PCP:  Hope Pigeon SOURCE: patient, notes from ED, admission and PCP, MRI and lab reports, MRI images on PACS  _________________________________   HISTORICAL  CHIEF COMPLAINT:  Chief Complaint  Patient presents with  . Multiple Sclerosis    Has had parts A and B of first Ocrelizumab infiusion (last week).  Denies new or worsening sx.  Sts. vision continues to improve, is less blurry/fim    HISTORY OF PRESENT ILLNESS:   Brianna Sanders is a 31 year old woman with MS.  MS:   She started ocrelizumab a couple weeks ago and she had the second half last week.   She tolerated it fairly well and just had a headache the second dose.      She has noted some seasonal allergies and has taken Benadryl.   In the past, she needed allergy eye drops.     Gait/strength/sensation: She denies any difficulty with her gait or balance. She does not note weakness.    Her right leg is mildly numb compared to the right.   Vision:   Her first exacerbation was optic neuritis.   She notes left vision has improved but not to baseline.     Bladder:   He denies any difficulty with urinary frequency, urgency, incontinence or hesitancy. She does not get urinary tract infections. There are no bowel issues.  Fatigue/sleep: She has not noted significant problems with fatigue. She falls asleep well most nights.   However, RLS has been doing worse.    Once asleep she sleeps well most nights.   Mood/cognition: She denies depression or anxiety. She has not noted difficulty with memory or other cognitive function.  Vit D:   This was low and she is taking 16109 weekly.   She will start 5000 daily when complete.     MS History:  On 11/30/2016, she had the onset of blurred vision and pain behind the left eye. She saw one ophthalmologist and told she had iritis and she was prescribed eye drops.    She saw another  ophthalmologist 12/11/2016 and was referred to the [facility] emergency room for further management of optic neuritis and further diagnostic workup.    An MRI of the brain showed multiple lesions consistent with MS and she was admitted for IV Solu-Medrol.  ANA, Lyme, RPR and HIV were negative. TSH was mildly low.  After the steroids, she felt that the vision improved quite a bit though it is still not back to baseline.    She does not recall any other episode of neurologic symptoms lasting over a day.    I reviewed the MRI of the brain and orbits performed 12/11/2016. The MRI of the orbits shows enhancement of the left optic nerve. The nerve is also hyperintense on 10 T2 weighted images. The MRI of the brain shows increased signal on diffusion-weighted images with in the optic nerve. Multiple T2/FLAIR hyperintense foci, mostly in the periventricular white matter with some of the foci are radially oriented to the ventricles. One left juxtacortical foci is also seen.   Four or five of the foci enhanced, including 2 of periventricular foci and the one left juxtacortical focus.     She started ocrelizumab April 2018.  REVIEW OF SYSTEMS: Constitutional: No fevers, chills, sweats, or change in appetite Eyes: see above. Ear, nose and throat: No hearing loss, ear pain, nasal congestion, sore throat Cardiovascular: No chest pain.  She has had recent palpitations (recent normal Holter) Respiratory: No shortness of breath at rest or with exertion.   No wheezes GastrointestinaI: No nausea, vomiting, diarrhea, abdominal pain, fecal incontinence Genitourinary: No dysuria, urinary retention or frequency.  No nocturia. Musculoskeletal: No neck pain, back pain Integumentary: No rash, pruritus, skin lesions Neurological: as above Psychiatric: No depression at this time.  No anxiety Endocrine: No palpitations, diaphoresis, change in appetite, change in weigh or increased thirst Hematologic/Lymphatic: No anemia,  purpura, petechiae. Allergic/Immunologic: No itchy/runny eyes, nasal congestion, recent allergic reactions, rashes  ALLERGIES: No Known Allergies  HOME MEDICATIONS:  Current Outpatient Prescriptions:  .  ibuprofen (ADVIL,MOTRIN) 800 MG tablet, Take 800 mg by mouth every 8 (eight) hours as needed for cramping., Disp: , Rfl:  .  ocrelizumab 600 mg in sodium chloride 0.9 % 500 mL, Inject 600 mg into the vein every 6 (six) months., Disp: , Rfl:  .  Vitamin D, Ergocalciferol, (DRISDOL) 50000 units CAPS capsule, , Disp: , Rfl: 0 .  levocetirizine (XYZAL) 5 MG tablet, Take 1 tablet (5 mg total) by mouth every evening., Disp: 30 tablet, Rfl: 3 .  rOPINIRole (REQUIP) 0.5 MG tablet, One or two po qHS, Disp: 60 tablet, Rfl: 5  PAST MEDICAL HISTORY: Past Medical History:  Diagnosis Date  . Palpitations   . Vision abnormalities     PAST SURGICAL HISTORY: No past surgical history on file.  FAMILY HISTORY: Family History  Problem Relation Age of Onset  . Healthy Mother   . Healthy Father     SOCIAL HISTORY:  Social History   Social History  . Marital status: Unknown    Spouse name: N/A  . Number of children: N/A  . Years of education: N/A   Occupational History  . Not on file.   Social History Main Topics  . Smoking status: Former Smoker    Quit date: 01/14/2012  . Smokeless tobacco: Never Used  . Alcohol use Yes  . Drug use: No  . Sexual activity: Yes    Birth control/ protection: Condom   Other Topics Concern  . Not on file   Social History Narrative  . No narrative on file     PHYSICAL EXAM  Vitals:   03/18/17 1457  BP: 119/80  Pulse: (!) 103  Resp: (!) 4  Weight: 137 lb 8 oz (62.4 kg)  Height: 5\' 5"  (1.651 m)    Body mass index is 22.88 kg/m.   General: The patient is well-developed and well-nourished and in no acute distress   Neurologic Exam  Mental status: The patient is alert and oriented x 3 at the time of the examination. The patient has  apparent normal recent and remote memory, with an apparently normal attention span and concentration ability.   Speech is normal.  Cranial nerves: Extraocular movements are full. She has a 2+ left APD.  Colors now symmetric.  Jill Alexanders fields are full.  . There is good facial sensation to soft touch bilaterally.Facial strength is normal.  Trapezius and sternocleidomastoid strength is normal. No dysarthria is noted.  The tongue is midline, and the patient has symmetric elevation of the soft palate. No obvious hearing deficits are noted.  Motor:  Muscle bulk is normal.   Tone is normal. Strength is  5 / 5 in all 4 extremities.   Sensory: Sensory testing is intact to pinprick, soft touch and vibration sensation in all 4 extremities.  Coordination: Cerebellar testing reveals good finger-nose-finger and heel-to-shin bilaterally.  Gait and station: Station is normal.   Gait is normal. Tandem gait is normal. Romberg is negative.   Reflexes: Deep tendon reflexes are symmetric and normal in arms. DTRs are 3+ at the knees and 2 at the ankles..   Plantar responses are flexor.    DIAGNOSTIC DATA (LABS, IMAGING, TESTING) - I reviewed patient records, labs, notes, testing and imaging myself where available.  Lab Results  Component Value Date   WBC 5.9 12/11/2016   HGB 13.3 12/11/2016   HCT 39.0 12/11/2016   MCV 85.5 12/11/2016   PLT 238 12/11/2016      Component Value Date/Time   NA 141 12/13/2016 0757   K 3.7 12/13/2016 0757   CL 109 12/13/2016 0757   CO2 23 12/13/2016 0757   GLUCOSE 134 (H) 12/13/2016 0757   BUN 10 12/13/2016 0757   CREATININE 0.83 12/13/2016 0757   CALCIUM 9.9 12/13/2016 0757   PROT 6.8 12/13/2016 0757   ALBUMIN 4.0 12/13/2016 0757   AST 16 12/13/2016 0757   ALT 8 (L) 12/13/2016 0757   ALKPHOS 47 12/13/2016 0757   BILITOT 0.2 (L) 12/13/2016 0757   GFRNONAA >60 12/13/2016 0757   GFRAA >60 12/13/2016 0757   No results found for: CHOL, HDL, LDLCALC, LDLDIRECT, TRIG,  CHOLHDL No results found for: [id number] Lab Results  Component Value Date   VITAMINB12 787 12/13/2016   Lab Results  Component Value Date   TSH 1.250 01/12/2017       ASSESSMENT AND PLAN    Multiple sclerosis (HCC)  Optic neuritis  High risk medication use  Seasonal allergic rhinitis due to other allergic trigger   1.  Ocrelizumbab q6 months, next dose in October. 2.  Ropinirole for RLS. 3.  Levocetirizine for allergies 4.  rtc 5 months, sooner if problems      Richard A. Epimenio Foot, MD, PhD 03/18/2017, 3:48 PM Certified in Neurology, Clinical Neurophysiology, Sleep Medicine, Pain Medicine and Neuroimaging  [facility] [address] [place], [place] [address] [phone]

## 2017-03-20 NOTE — Telephone Encounter (Signed)
Pt did not check out on 5/2. Called and LVM to schedule 5 mo f/u.

## 2017-03-22 ENCOUNTER — Encounter: Payer: Self-pay | Admitting: Neurology

## 2017-03-23 ENCOUNTER — Ambulatory Visit (INDEPENDENT_AMBULATORY_CARE_PROVIDER_SITE_OTHER): Payer: BLUE CROSS/BLUE SHIELD | Admitting: Neurology

## 2017-03-23 ENCOUNTER — Encounter: Payer: Self-pay | Admitting: Neurology

## 2017-03-23 VITALS — BP 107/69 | HR 91 | Resp 4 | Ht 65.0 in | Wt 139.0 lb

## 2017-03-23 DIAGNOSIS — G35 Multiple sclerosis: Secondary | ICD-10-CM | POA: Diagnosis not present

## 2017-03-23 DIAGNOSIS — M542 Cervicalgia: Secondary | ICD-10-CM | POA: Diagnosis not present

## 2017-03-23 DIAGNOSIS — M549 Dorsalgia, unspecified: Secondary | ICD-10-CM | POA: Diagnosis not present

## 2017-03-23 MED ORDER — CYCLOBENZAPRINE HCL 5 MG PO TABS: 5.0000 mg | ORAL_TABLET | Freq: Three times a day (TID) | ORAL | 0 refills | Status: DC | PRN

## 2017-03-23 NOTE — Progress Notes (Signed)
[facility]  PATIENT: Brianna Sanders DOB: 1985-11-23  REFERRING DOCTOR OR PCP:  Hope Pigeon SOURCE: patient, notes from ED, admission and PCP, MRI and lab reports, MRI images on PACS  _________________________________   HISTORICAL  CHIEF COMPLAINT:  Chief Complaint  Patient presents with  . Multiple Sclerosis    Here today with new c/o upper back and neck pain onset 3 days ago without known injury.  Pain with movement only/fim  . Neck Pain  . Back Pain    HISTORY OF PRESENT ILLNESS:   Brianna Sanders is a 31 year old woman with MS.  She woke up fine Saturday morning but had more and more lower neck/upper back pain as the day went on.   Her neck seems stiff.    She denies any significant new numbness, weakness or change in gait.     No change in bladder.    Pain is worse with laying down and has bene worse at bedtime.     She denies any new numbness, weakness, change in balance/gait or change in bladder.  MS:   She started ocrelizumab April 2018.   She tolerated it fairly well and just had a headache the second dose.      MS History:  On 11/30/2016, she had the onset of blurred vision and pain behind the left eye. She saw one ophthalmologist and told she had iritis and she was prescribed eye drops.    She saw another ophthalmologist 12/11/2016 and was referred to the [facility] emergency room for further management of optic neuritis and further diagnostic workup.    An MRI of the brain showed multiple lesions consistent with MS and she was admitted for IV Solu-Medrol.  ANA, Lyme, RPR and HIV were negative. TSH was mildly low.  After the steroids, she felt that the vision improved quite a bit though it is still not back to baseline.    She does not recall any other episode of neurologic symptoms lasting over a day.    I reviewed the MRI of the brain and orbits performed 12/11/2016. The MRI of the orbits shows enhancement of the left optic nerve. The nerve  is also hyperintense on 10 T2 weighted images. The MRI of the brain shows increased signal on diffusion-weighted images with in the optic nerve. Multiple T2/FLAIR hyperintense foci, mostly in the periventricular white matter with some of the foci are radially oriented to the ventricles. One left juxtacortical foci is also seen.   Four or five of the foci enhanced, including 2 of periventricular foci and the one left juxtacortical focus.     She started ocrelizumab April 2018.  REVIEW OF SYSTEMS: Constitutional: No fevers, chills, sweats, or change in appetite Eyes: see above. Ear, nose and throat: No hearing loss, ear pain, nasal congestion, sore throat Cardiovascular: No chest pain.   She has had recent palpitations (recent normal Holter) Respiratory: No shortness of breath at rest or with exertion.   No wheezes GastrointestinaI: No nausea, vomiting, diarrhea, abdominal pain, fecal incontinence Genitourinary: No dysuria, urinary retention or frequency.  No nocturia. Musculoskeletal: No neck pain, back pain Integumentary: No rash, pruritus, skin lesions Neurological: as above Psychiatric: No depression at this time.  No anxiety Endocrine: No palpitations, diaphoresis, change in appetite, change in weigh or increased thirst Hematologic/Lymphatic: No anemia, purpura, petechiae. Allergic/Immunologic: No itchy/runny eyes, nasal congestion, recent allergic reactions, rashes  ALLERGIES: No Known Allergies  HOME MEDICATIONS:  Current Outpatient Prescriptions:  .  ibuprofen (ADVIL,MOTRIN) 800  MG tablet, Take 800 mg by mouth every 8 (eight) hours as needed for cramping., Disp: , Rfl:  .  levocetirizine (XYZAL) 5 MG tablet, Take 1 tablet (5 mg total) by mouth every evening., Disp: 30 tablet, Rfl: 3 .  ocrelizumab 600 mg in sodium chloride 0.9 % 500 mL, Inject 600 mg into the vein every 6 (six) months., Disp: , Rfl:  .  rOPINIRole (REQUIP) 0.5 MG tablet, One or two po qHS, Disp: 60 tablet, Rfl:  5 .  Vitamin D, Ergocalciferol, (DRISDOL) 50000 units CAPS capsule, , Disp: , Rfl: 0 .  cyclobenzaprine (FLEXERIL) 5 MG tablet, Take 1 tablet (5 mg total) by mouth every 8 (eight) hours as needed for muscle spasms., Disp: 90 tablet, Rfl: 0  PAST MEDICAL HISTORY: Past Medical History:  Diagnosis Date  . Palpitations   . Vision abnormalities     PAST SURGICAL HISTORY: No past surgical history on file.  FAMILY HISTORY: Family History  Problem Relation Age of Onset  . Healthy Mother   . Healthy Father     SOCIAL HISTORY:  Social History   Social History  . Marital status: Unknown    Spouse name: N/A  . Number of children: N/A  . Years of education: N/A   Occupational History  . Not on file.   Social History Main Topics  . Smoking status: Former Smoker    Quit date: 01/14/2012  . Smokeless tobacco: Never Used  . Alcohol use Yes  . Drug use: No  . Sexual activity: Yes    Birth control/ protection: Condom   Other Topics Concern  . Not on file   Social History Narrative  . No narrative on file     PHYSICAL EXAM  Vitals:   03/23/17 1316  BP: 107/69  Pulse: 91  Resp: (!) 4  Weight: 139 lb (63 kg)  Height: 5\' 5"  (1.651 m)    Body mass index is 23.13 kg/m.   General: The patient is well-developed and well-nourished and in no acute distress   Neurologic Exam  Mental status: The patient is alert and oriented x 3 at the time of the examination. The patient has apparent normal recent and remote memory, with an apparently normal attention span and concentration ability.   Speech is normal.  Cranial nerves: Extraocular movements are full. She has a 2+ left APD.  Colors now symmetric.  Jill Alexanders fields are full.  . There is good facial sensation to soft touch bilaterally.Facial strength is normal.  Trapezius and sternocleidomastoid strength is normal. No dysarthria is noted.  The tongue is midline, and the patient has symmetric elevation of the soft palate. No  obvious hearing deficits are noted.  Motor:  Muscle bulk is normal.   Tone is normal. Strength is  5 / 5 in all 4 extremities.   Sensory: Sensory testing is intact to pinprick, soft touch and vibration sensation in all 4 extremities.  Coordination: Cerebellar testing reveals good finger-nose-finger and heel-to-shin bilaterally.  Gait and station: Station is normal.   Gait is normal. Tandem gait is normal. Romberg is negative.   Reflexes: Deep tendon reflexes are symmetric and normal in arms. DTRs are 3+ at the knees and 2 at the ankles..   Plantar responses are flexor.    DIAGNOSTIC DATA (LABS, IMAGING, TESTING) - I reviewed patient records, labs, notes, testing and imaging myself where available.  Lab Results  Component Value Date   WBC 5.9 12/11/2016   HGB 13.3 12/11/2016  HCT 39.0 12/11/2016   MCV 85.5 12/11/2016   PLT 238 12/11/2016      Component Value Date/Time   NA 141 12/13/2016 0757   K 3.7 12/13/2016 0757   CL 109 12/13/2016 0757   CO2 23 12/13/2016 0757   GLUCOSE 134 (H) 12/13/2016 0757   BUN 10 12/13/2016 0757   CREATININE 0.83 12/13/2016 0757   CALCIUM 9.9 12/13/2016 0757   PROT 6.8 12/13/2016 0757   ALBUMIN 4.0 12/13/2016 0757   AST 16 12/13/2016 0757   ALT 8 (L) 12/13/2016 0757   ALKPHOS 47 12/13/2016 0757   BILITOT 0.2 (L) 12/13/2016 0757   GFRNONAA >60 12/13/2016 0757   GFRAA >60 12/13/2016 0757   No results found for: CHOL, HDL, LDLCALC, LDLDIRECT, TRIG, CHOLHDL No results found for: ZOXW9U Lab Results  Component Value Date   VITAMINB12 787 12/13/2016   Lab Results  Component Value Date   TSH 1.250 01/12/2017       ASSESSMENT AND PLAN   Multiple sclerosis (HCC)  Neck pain  Upper back pain   1.  Continue Ocrelizumbab q6 months for MS, next dose in October. 2.  Trigger point injection of the C6-C7, trapezius and rhomboid muscles bilaterally. With 80 mg Depo-Medrol in 5 mL Marcaine. She tolerated the injections well.  3.  If not  better later this week, we need to check an MRI of the cervical spine to make sure that there is not a new plaque from MS contributing to her symptoms. 4.   rtc 5 months, sooner if problems      Nolan Lasser A. Epimenio Foot, MD, PhD 03/23/2017, 2:56 PM Certified in Neurology, Clinical Neurophysiology, Sleep Medicine, Pain Medicine and Neuroimaging  [facility] [address] [place], [place] [address] [phone]

## 2017-03-23 NOTE — Telephone Encounter (Signed)
Pt calling back because she is in a lot of pain, I did schedule her an appointment for 5-8 but she would like something for pain.

## 2017-03-24 ENCOUNTER — Ambulatory Visit: Payer: BLUE CROSS/BLUE SHIELD | Admitting: Neurology

## 2017-04-18 ENCOUNTER — Other Ambulatory Visit: Payer: Self-pay | Admitting: Neurology

## 2017-04-19 ENCOUNTER — Encounter: Payer: Self-pay | Admitting: Neurology

## 2017-04-28 ENCOUNTER — Telehealth: Payer: Self-pay | Admitting: *Deleted

## 2017-04-28 MED ORDER — LEVOCETIRIZINE DIHYDROCHLORIDE 5 MG PO TABS: 5.0000 mg | ORAL_TABLET | Freq: Every evening | ORAL | 3 refills | Status: DC

## 2017-04-28 NOTE — Telephone Encounter (Signed)
Xyzal escribed to [facility] per faxed request/fim

## 2017-06-16 ENCOUNTER — Encounter: Payer: Self-pay | Admitting: Neurology

## 2017-08-24 ENCOUNTER — Encounter: Payer: Self-pay | Admitting: Neurology

## 2017-08-24 ENCOUNTER — Ambulatory Visit (INDEPENDENT_AMBULATORY_CARE_PROVIDER_SITE_OTHER): Payer: BLUE CROSS/BLUE SHIELD | Admitting: Neurology

## 2017-08-24 VITALS — BP 109/70 | HR 83 | Resp 14 | Ht 65.0 in | Wt 139.0 lb

## 2017-08-24 DIAGNOSIS — Z79899 Other long term (current) drug therapy: Secondary | ICD-10-CM | POA: Diagnosis not present

## 2017-08-24 DIAGNOSIS — M542 Cervicalgia: Secondary | ICD-10-CM | POA: Diagnosis not present

## 2017-08-24 DIAGNOSIS — G2581 Restless legs syndrome: Secondary | ICD-10-CM | POA: Diagnosis not present

## 2017-08-24 DIAGNOSIS — H469 Unspecified optic neuritis: Secondary | ICD-10-CM | POA: Diagnosis not present

## 2017-08-24 DIAGNOSIS — G35 Multiple sclerosis: Secondary | ICD-10-CM

## 2017-08-24 MED ORDER — CYCLOBENZAPRINE HCL 5 MG PO TABS: 5.0000 mg | ORAL_TABLET | Freq: Three times a day (TID) | ORAL | 0 refills | Status: DC | PRN

## 2017-08-24 MED ORDER — SCOPOLAMINE 1 MG/3DAYS TD PT72: 1.0000 | MEDICATED_PATCH | TRANSDERMAL | 1 refills | Status: DC

## 2017-08-24 MED ORDER — LEVOCETIRIZINE DIHYDROCHLORIDE 5 MG PO TABS: 5.0000 mg | ORAL_TABLET | Freq: Every evening | ORAL | 3 refills | Status: AC

## 2017-08-24 MED ORDER — IBUPROFEN 800 MG PO TABS: 800.0000 mg | ORAL_TABLET | Freq: Three times a day (TID) | ORAL | 11 refills | Status: AC | PRN

## 2017-08-24 MED ORDER — GABAPENTIN 600 MG PO TABS: 600.0000 mg | ORAL_TABLET | Freq: Every day | ORAL | 5 refills | Status: DC

## 2017-08-24 NOTE — Progress Notes (Signed)
[facility]  PATIENT: Brianna Sanders DOB: 01-28-1986  REFERRING DOCTOR OR PCP:  Hope Pigeon SOURCE: patient, notes from ED, admission and PCP, MRI and lab reports, MRI images on PACS  _________________________________   HISTORICAL  CHIEF COMPLAINT:  Chief Complaint  Patient presents with  . Multiple Sclerosis    Next Ocrevus infusion due this month.  Intrafusion is working on Therapist, occupational.  Sts. Requip, Flexeril did not help RLS, so she stopped them./fim    HISTORY OF PRESENT ILLNESS:   Brianna Sanders is a 31 year old woman with MS.  Update 08/25/2027:     She had her first 1/2 dose of ocrelizumab 02/18/2017 and the second half of the dose 2 weeks later. She is now due for her next dose (6 months) but her insurance company has not yet given the go-ahead.   She tolerated the infusions well. She has not had any exacerbations since starting ocrelizumab.    The neck pain that she was experiencing at the last follow-up is better. The trigger point injection helped. She continues to have leg cramps that are bothersome at night.  Ropinirole was not tolerated. Flexeril helped mildly.   Her gait is usually good but she sometimes loses balance.   The right leg is mildly weak at times but she denies foot drop, even if she walks a long distance.     She notes a little tingling and numbness in her hands, but nothing troubling.     Vision is doing better but she continues to have mild duties visual acuity and altered color vision out of the left eye since her optic neuritis earlier this year.   There is no diplopia..   Bladder function is good.     She felt some fatigue over the summer, worse with heat.      She sleeps well most nights but sometimes the cramps keep her from falling asleep.   She denies any depression or anxiety.   She feels cognition is doing well though sometimes she is mildly forgetful.    ________________________-- From 04/03/2017:  She  woke up fine Saturday morning but had more and more lower neck/upper back pain as the day went on.   Her neck seems stiff.    She denies any significant new numbness, weakness or change in gait.     No change in bladder.    Pain is worse with laying down and has bene worse at bedtime.     She denies any new numbness, weakness, change in balance/gait or change in bladder.  MS:   She started ocrelizumab April 2018.   She tolerated it fairly well and just had a headache the second dose.      MS History:  On 11/30/2016, she had the onset of blurred vision and pain behind the left eye. She saw one ophthalmologist and told she had iritis and she was prescribed eye drops.    She saw another ophthalmologist 12/11/2016 and was referred to the [facility] emergency room for further management of optic neuritis and further diagnostic workup.    An MRI of the brain showed multiple lesions consistent with MS and she was admitted for IV Solu-Medrol.  ANA, Lyme, RPR and HIV were negative. TSH was mildly low.  After the steroids, she felt that the vision improved quite a bit though it is still not back to baseline.    She does not recall any other episode of neurologic symptoms lasting over a day.  I reviewed the MRI of the brain and orbits performed 12/11/2016. The MRI of the orbits shows enhancement of the left optic nerve. The nerve is also hyperintense on 10 T2 weighted images. The MRI of the brain shows increased signal on diffusion-weighted images with in the optic nerve. Multiple T2/FLAIR hyperintense foci, mostly in the periventricular white matter with some of the foci are radially oriented to the ventricles. One left juxtacortical foci is also seen.   Four or five of the foci enhanced, including 2 of periventricular foci and the one left juxtacortical focus.     She started ocrelizumab April 2018.  REVIEW OF SYSTEMS: Constitutional: No fevers, chills, sweats, or change in appetite Eyes: see above. Ear, nose and  throat: No hearing loss, ear pain, nasal congestion, sore throat Cardiovascular: No chest pain.   She has had recent palpitations (recent normal Holter) Respiratory: No shortness of breath at rest or with exertion.   No wheezes GastrointestinaI: No nausea, vomiting, diarrhea, abdominal pain, fecal incontinence Genitourinary: No dysuria, urinary retention or frequency.  No nocturia. Musculoskeletal: No neck pain, back pain Integumentary: No rash, pruritus, skin lesions Neurological: as above Psychiatric: No depression at this time.  No anxiety Endocrine: No palpitations, diaphoresis, change in appetite, change in weigh or increased thirst Hematologic/Lymphatic: No anemia, purpura, petechiae. Allergic/Immunologic: No itchy/runny eyes, nasal congestion, recent allergic reactions, rashes  ALLERGIES: No Known Allergies  HOME MEDICATIONS:  Current Outpatient Prescriptions:  .  ibuprofen (ADVIL,MOTRIN) 800 MG tablet, Take 1 tablet (800 mg total) by mouth every 8 (eight) hours as needed for cramping., Disp: 30 tablet, Rfl: 11 .  levocetirizine (XYZAL) 5 MG tablet, Take 1 tablet (5 mg total) by mouth every evening., Disp: 90 tablet, Rfl: 3 .  ocrelizumab 600 mg in sodium chloride 0.9 % 500 mL, Inject 600 mg into the vein every 6 (six) months., Disp: , Rfl:  .  cyclobenzaprine (FLEXERIL) 5 MG tablet, Take 1 tablet (5 mg total) by mouth every 8 (eight) hours as needed for muscle spasms., Disp: 90 tablet, Rfl: 0 .  gabapentin (NEURONTIN) 600 MG tablet, Take 1 tablet (600 mg total) by mouth at bedtime., Disp: 30 tablet, Rfl: 5 .  scopolamine (TRANSDERM-SCOP) 1 MG/3DAYS, Place 1 patch (1.5 mg total) onto the skin every 3 (three) days., Disp: 10 patch, Rfl: 1  PAST MEDICAL HISTORY: Past Medical History:  Diagnosis Date  . Palpitations   . Vision abnormalities     PAST SURGICAL HISTORY: No past surgical history on file.  FAMILY HISTORY: Family History  Problem Relation Age of Onset  .  Healthy Mother   . Healthy Father     SOCIAL HISTORY:  Social History   Social History  . Marital status: Unknown    Spouse name: N/A  . Number of children: N/A  . Years of education: N/A   Occupational History  . Not on file.   Social History Main Topics  . Smoking status: Former Smoker    Quit date: 01/14/2012  . Smokeless tobacco: Never Used  . Alcohol use Yes  . Drug use: No  . Sexual activity: Yes    Birth control/ protection: Condom   Other Topics Concern  . Not on file   Social History Narrative  . No narrative on file     PHYSICAL EXAM  Vitals:   08/24/17 1120  BP: 109/70  Pulse: 83  Resp: 14  Weight: 139 lb (63 kg)  Height:  (1.651 m)    Body  mass index is 23.13 kg/m.   General: The patient is well-developed and well-nourished and in no acute distress   Neurologic Exam  Mental status: The patient is alert and oriented x 3 at the time of the examination. The patient has apparent normal recent and remote memory, with an apparently normal attention span and concentration ability.   Speech is normal.  Cranial nerves: Extraocular movements are full. He has a 2+ left APD. There is mildly reduced color vision out of the left eye. Visual fields appeared to be normal. Facial strength and sensation was normal. Trapezius strength was normal.   The tongue is midline, and the patient has symmetric elevation of the soft palate. No obvious hearing deficits are noted.  Motor:  Muscle bulk is normal.   Tone is normal. Strength is  5 / 5 in all 4 extremities.   Sensory: Sensory testing intact to touch, temperature and vibration in the arms and legs.  Coordination: Cerebellar testing shows good finger-nose-finger and heel-to-shin bilaterally..  Gait and station: Station is normal.   Gait is normal. Tandem gait is normal. Romberg is negative.   Reflexes: Deep tendon reflexes are symmetric and normal in arms. DTRs are 3+ at the knees and 2 at the ankles.Marland Kitchen         DIAGNOSTIC DATA (LABS, IMAGING, TESTING) - I reviewed patient records, labs, notes, testing and imaging myself where available.  Lab Results  Component Value Date   WBC 5.9 12/11/2016   HGB 13.3 12/11/2016   HCT 39.0 12/11/2016   MCV 85.5 12/11/2016   PLT 238 12/11/2016      Component Value Date/Time   NA 141 12/13/2016 0757   K 3.7 12/13/2016 0757   CL 109 12/13/2016 0757   CO2 23 12/13/2016 0757   GLUCOSE 134 (H) 12/13/2016 0757   BUN 10 12/13/2016 0757   CREATININE 0.83 12/13/2016 0757   CALCIUM 9.9 12/13/2016 0757   PROT 6.8 12/13/2016 0757   ALBUMIN 4.0 12/13/2016 0757   AST 16 12/13/2016 0757   ALT 8 (L) 12/13/2016 0757   ALKPHOS 47 12/13/2016 0757   BILITOT 0.2 (L) 12/13/2016 0757   GFRNONAA >60 12/13/2016 0757   GFRAA >60 12/13/2016 0757   No results found for: CHOL, HDL, LDLCALC, LDLDIRECT, TRIG, CHOLHDL No results found for: FMBB4Y Lab Results  Component Value Date   VITAMINB12 787 12/13/2016   Lab Results  Component Value Date   TSH 1.250 01/12/2017       ASSESSMENT AND PLAN   Multiple sclerosis (HCC) - Plan: MR BRAIN W WO CONTRAST, CBC with Differential/Platelet, Hepatic function panel  Optic neuritis  High risk medication use  Neck pain  Restless legs   1.  She is due for her six-month dose of ocrelizumab is month. Her first dose was 02/18/2017.   The infusion staff will fax in this note to help her get authorization for the next dose. 2.  MRI of the brain with and without contrast after the next dose and by the end of the year to rule out subclinical progression to ensure that the disease modifying therapy is effective. 3.   To help with the leg cramp she will continue Flexeril. We'll also have her try gabapentin to see if that is more effective for the RLS/cramps 4.   rtc 5 months, sooner if problems      Richard A. Epimenio Foot, MD, PhD 08/24/2017, 12:03 PM Certified in Neurology, Clinical Neurophysiology, Sleep Medicine, Pain  Medicine and Neuroimaging  [facility] [address], [address] [place], [place] [address] [phone] 

## 2017-08-25 ENCOUNTER — Telehealth: Payer: Self-pay | Admitting: *Deleted

## 2017-08-25 ENCOUNTER — Other Ambulatory Visit: Payer: BLUE CROSS/BLUE SHIELD

## 2017-08-25 LAB — HEPATIC FUNCTION PANEL
ALBUMIN: 4.5 g/dL (ref 3.5–5.5)
ALT: 7 IU/L (ref 0–32)
AST: 14 IU/L (ref 0–40)
Alkaline Phosphatase: 52 IU/L (ref 39–117)
Bilirubin Total: 0.3 mg/dL (ref 0.0–1.2)
Bilirubin, Direct: 0.07 mg/dL (ref 0.00–0.40)
Total Protein: 6.5 g/dL (ref 6.0–8.5)

## 2017-08-25 LAB — CBC WITH DIFFERENTIAL/PLATELET
BASOS ABS: 0 10*3/uL (ref 0.0–0.2)
Basos: 0 %
EOS (ABSOLUTE): 0.1 10*3/uL (ref 0.0–0.4)
Eos: 1 %
Hematocrit: 38.8 % (ref 34.0–46.6)
Hemoglobin: 12 g/dL (ref 11.1–15.9)
Immature Grans (Abs): 0 10*3/uL (ref 0.0–0.1)
Immature Granulocytes: 0 %
LYMPHS ABS: 1.8 10*3/uL (ref 0.7–3.1)
LYMPHS: 38 %
MCH: 28 pg (ref 26.6–33.0)
MCHC: 30.9 g/dL — AB (ref 31.5–35.7)
MCV: 91 fL (ref 79–97)
MONOCYTES: 8 %
MONOS ABS: 0.4 10*3/uL (ref 0.1–0.9)
NEUTROS PCT: 53 %
Neutrophils Absolute: 2.5 10*3/uL (ref 1.4–7.0)
Platelets: 284 10*3/uL (ref 150–379)
RBC: 4.28 x10E6/uL (ref 3.77–5.28)
RDW: 15.3 % (ref 12.3–15.4)
WBC: 4.7 10*3/uL (ref 3.4–10.8)

## 2017-08-25 NOTE — Telephone Encounter (Signed)
-----   Message from Asa Lente, MD sent at 08/25/2017  9:52 AM EDT ----- Please let the patient know that the lab work is fine.

## 2017-08-25 NOTE — Telephone Encounter (Signed)
Spoke with Brianna Sanders this morning and reviewed lab results as below.  She verbalized understanding of same/fim

## 2017-08-26 ENCOUNTER — Ambulatory Visit (INDEPENDENT_AMBULATORY_CARE_PROVIDER_SITE_OTHER): Payer: BLUE CROSS/BLUE SHIELD

## 2017-08-26 ENCOUNTER — Telehealth: Payer: Self-pay | Admitting: *Deleted

## 2017-08-26 DIAGNOSIS — G35 Multiple sclerosis: Secondary | ICD-10-CM | POA: Diagnosis not present

## 2017-08-26 MED ORDER — GADOPENTETATE DIMEGLUMINE 469.01 MG/ML IV SOLN: 14.0000 mL | Freq: Once | INTRAVENOUS | Status: AC | PRN

## 2017-08-26 NOTE — Telephone Encounter (Signed)
-----   Message from Asa Lente, MD sent at 08/26/2017  1:56 PM EDT ----- Please let her know that the MRI of the brain does not show any new lesions since the one she had at the beginning of the year.

## 2017-08-26 NOTE — Telephone Encounter (Signed)
I have spoken with Brianna Sanders and reviewed MRI results as below/fim

## 2017-09-22 ENCOUNTER — Encounter: Payer: Self-pay | Admitting: Neurology

## 2017-09-25 IMAGING — MR MR ORBITS WO/W CM
11 of 18 series · 30 of 48 positions shown · IV contrast (multihance)
Comparison: None available.

CLINICAL DATA: Initial evaluation for acute blurry vision with pain
behind left eye.

EXAM:
MRI HEAD AND ORBITS WITHOUT AND WITH CONTRAST
TECHNIQUE: Multiplanar, multiecho pulse sequences of the brain and surrounding
structures were obtained without and with intravenous contrast.
Multiplanar, multiecho pulse sequences of the orbits and surrounding
structures were obtained including fat saturation techniques, before
and after intravenous contrast administration.
CONTRAST:  10mL MULTIHANCE GADOBENATE DIMEGLUMINE 529 MG/ML IV SOLN

[Series 3: DWI · axial · 3.0mm · 1.09mm/px · z∈[-71,+57]mm · 7 of 92 slices shown (1 of 4)]
[im 1/92]
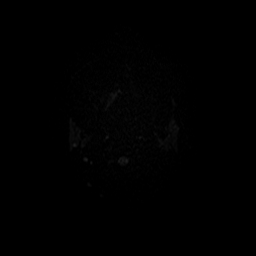
[im 16/92]
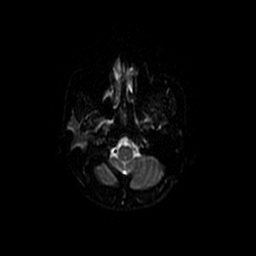
[im 31/92]
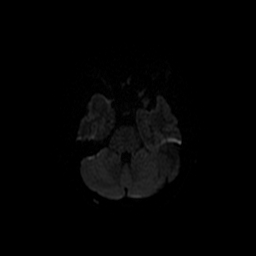
[im 46/92]
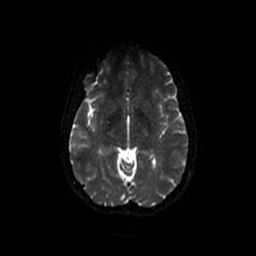
[im 61/92]
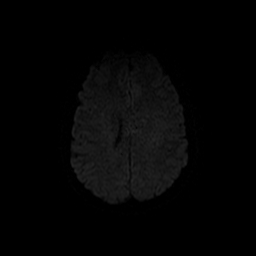
[im 76/92]
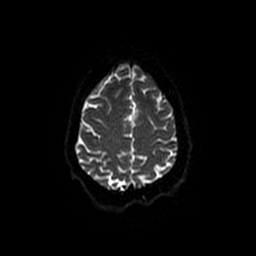
[im 92/92]
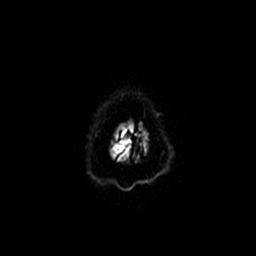

[Series 4: DWI · coronal · 5.0mm · 1.09mm/px · 6 of 66 slices shown (2 of 4)]
[im 1/66]
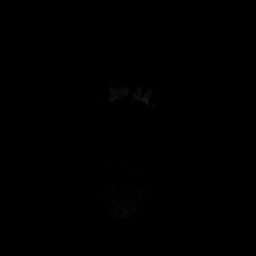
[im 14/66]
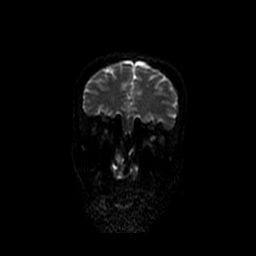
[im 27/66]
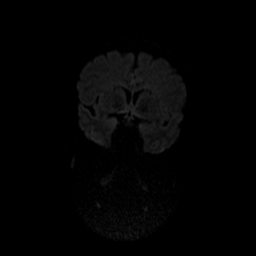
[im 40/66]
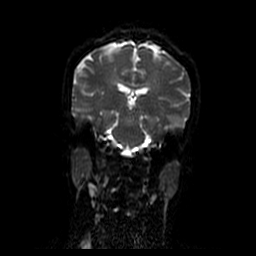
[im 53/66]
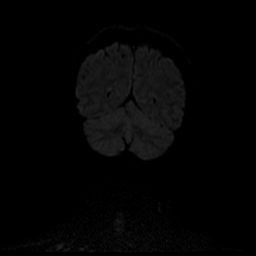
[im 66/66]
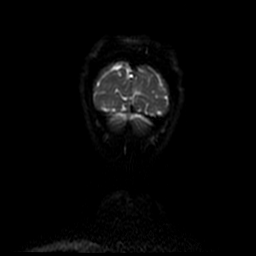

[Series 6: T2 · axial · 5.0mm · 0.43mm/px · z∈[-61,+65]mm · 2 of 23 slices shown (1 of 2)]
[im 1/23]
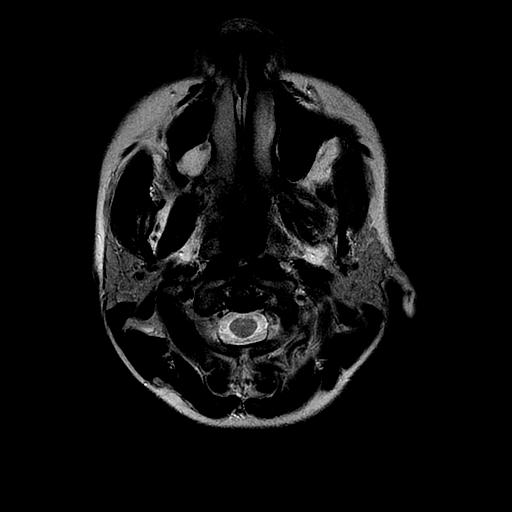
[im 23/23]
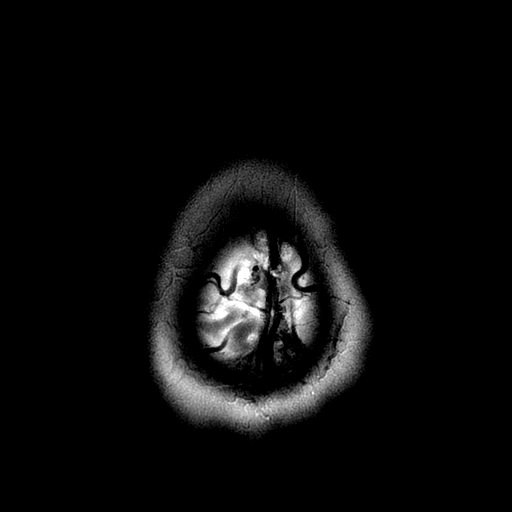

[Series 7: FLAIR · axial · 5.0mm · 0.43mm/px · z∈[-61,+65]mm · 2 of 23 slices shown]
[im 1/23]
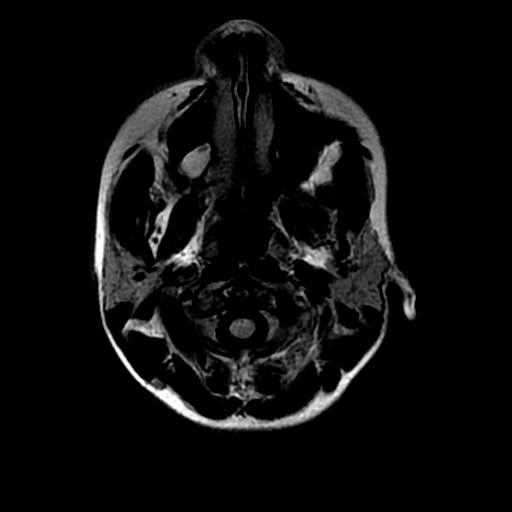
[im 23/23]
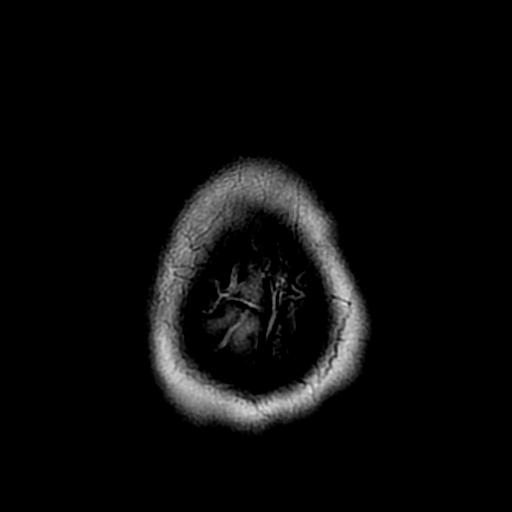

[Series 10: T2 · coronal · 5.0mm · 0.43mm/px · 2 of 28 slices shown (2 of 2)]
[im 1/28]
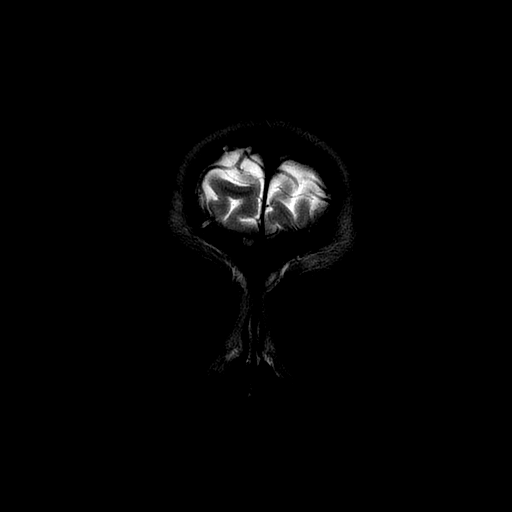
[im 28/28]
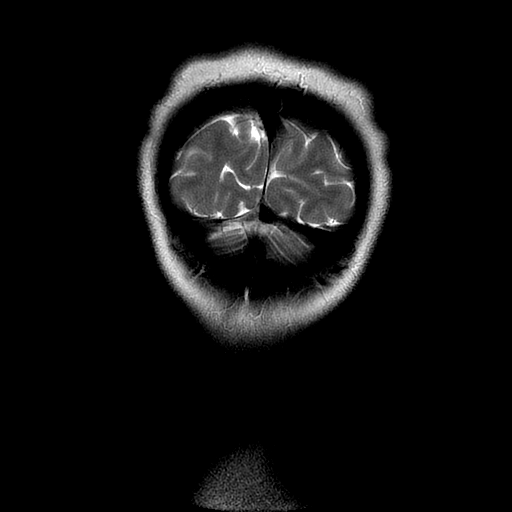

[Series 13: T2 fat-sat · axial · 3.0mm · 0.35mm/px · 1 of 18 slices shown]
[im 1/18]
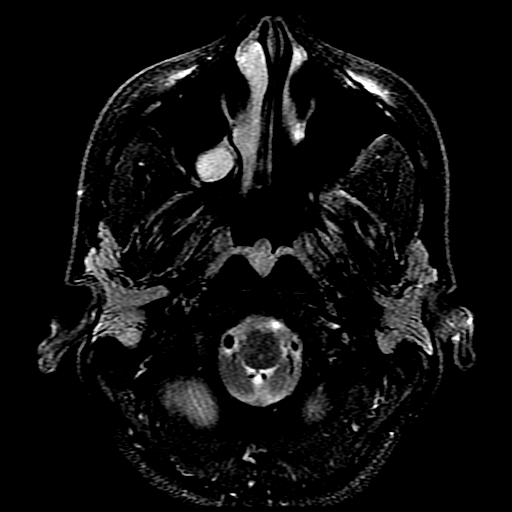

[Series 15: T1 post-contrast · axial · 3.0mm · 0.35mm/px · 1 of 19 slices shown (1 of 3)]
[im 1/19]
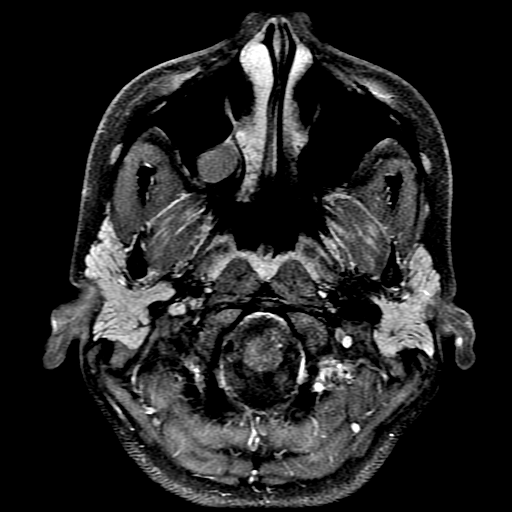

[Series 16: T1 post-contrast · coronal · 3.0mm · 0.35mm/px · 2 of 35 slices shown (2 of 3)]
[im 1/35]
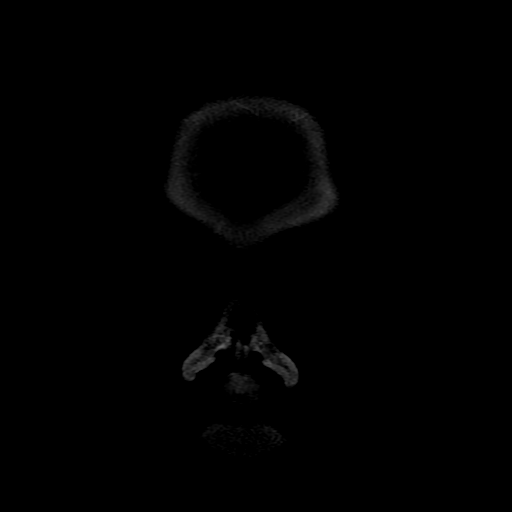
[im 35/35]
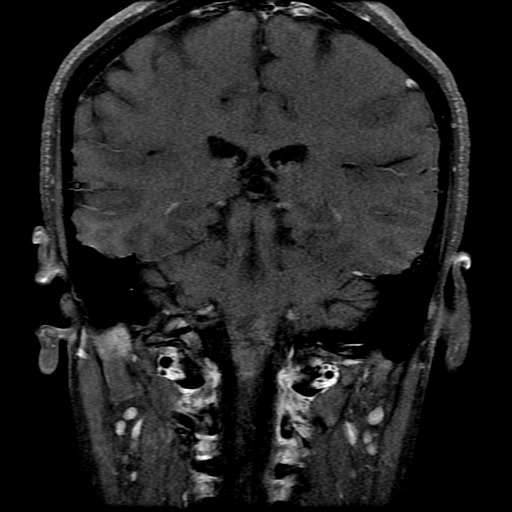

[Series 18: T1 post-contrast · coronal · 5.0mm · 0.43mm/px · 2 of 28 slices shown (3 of 3)]
[im 1/28]
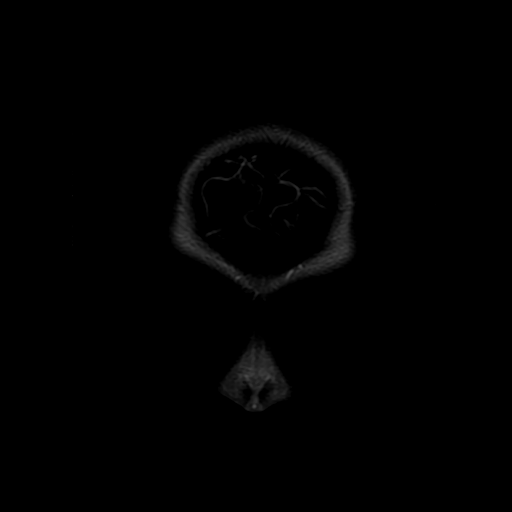
[im 28/28]
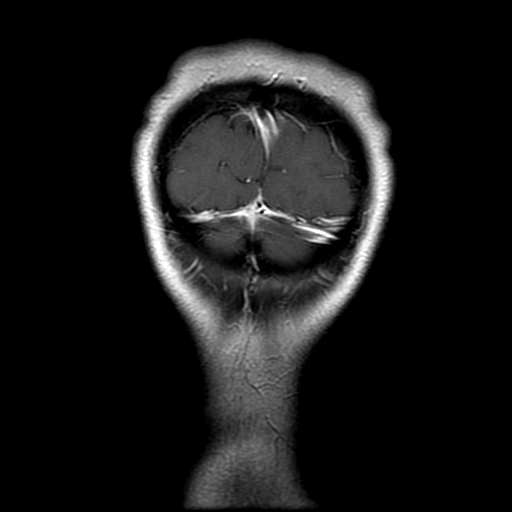

[Series 300: DWI · axial · 3.0mm · 1.09mm/px · z∈[-71,+57]mm · 3 of 46 slices shown (3 of 4)]
[im 1/46]
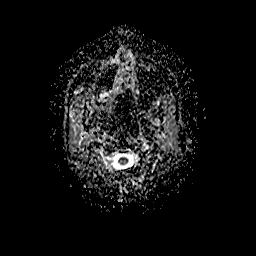
[im 23/46]
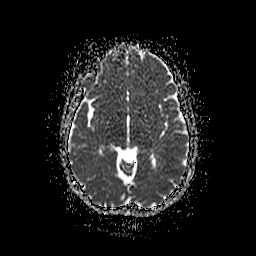
[im 46/46]
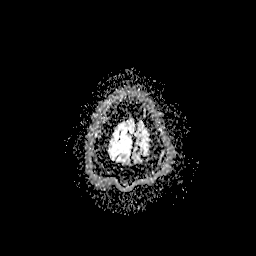

[Series 400: DWI · coronal · 5.0mm · 1.09mm/px · 2 of 33 slices shown (4 of 4)]
[im 1/33]
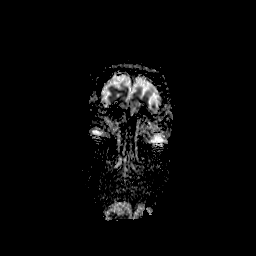
[im 33/33]
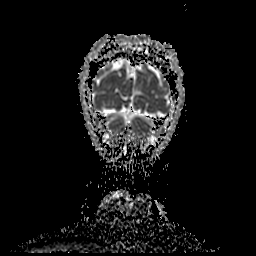

[30 of 48 positions shown; findings below may reference images not displayed]

FINDINGS: MRI HEAD FINDINGS

Brain: Cerebral volume within normal limits for age. There are
several scattered patchy T2/FLAIR hyperintense lesions involving
predominantly in the periventricular white matter of both cerebral
hemispheres, with a few additional small subcortical lesions
present. Many of these lesions are oriented perpendicular to the
lateral ventricles, and demonstrate precontrast T1 hypointense
signal intensity. Overall distribution appearance is most consistent
with underlying demyelinating disease, multiple sclerosis. Faint
diffusion abnormality with associated enhancement about a few of
these lesions is most consistent with active demyelination. No
infratentorial lesions identified.

No other evidence for acute infarct. No evidence for acute or
chronic intracranial hemorrhage. No mass lesion, midline shift or
mass effect. No hydrocephalus. Major dural sinuses are grossly
patent.

Pituitary gland mildly prominent with convex border superiorly,
likely within normal limits for age.

Vascular: Major intracranial vascular flow voids are maintained.

Skull and upper cervical spine: Craniocervical junction normal.
Visualized upper cervical spine unremarkable. Bone marrow signal
intensity somewhat diffusely decreased on T1 weighted signal
intensity, most commonly related to anemia, smoking, and obesity.
Scalp soft tissues demonstrate no acute abnormality.

Other: Retention cyst noted within the right maxillary sinus.
Scattered mucosal thickening within the ethmoidal air cells.
Paranasal sinuses are otherwise clear. No mastoid effusion. Inner
ear structures grossly normal.

MRI ORBITS FINDINGS

Orbits: Globes are symmetric in size with normal appearance.
Extra-ocular muscles symmetric and within normal limits. Lacrimal
glands normal. Superior ophthalmic veins within normal limits.

Left optic nerve is somewhat diffusely enlarged as compared to the
right. Scattered T2 hyperintensity surrounds the optic nerve and is
present within the nerve itself, suggesting edema. There is
associated fuzzy perineural enhancement, with additional discontinue
areas of enhancement within the nerve itself. Changes consistent
with acute optic neuritis. Changes extend to near the level of the
left orbital apex. Optic chiasm itself is normal in appearance.
Right optic nerve is normal in appearance. Intraconal and extraconal
fat well-maintained.

Visualized sinuses: Mucous retention cyst right maxillary sinus with
scattered mucosal thickening within the ethmoidal air cells.
Paranasal sinuses are otherwise clear.

Soft tissues: Periorbital soft tissues within normal limits.
IMPRESSION: 1. Cerebral white matter disease in a distribution most consistent
with demyelinating disease/multiple sclerosis. Mild enhancement
about a few scattered lesions consistent with active demyelination.
2. Findings consistent with acute left optic neuritis as above.

## 2017-11-05 ENCOUNTER — Encounter: Payer: Self-pay | Admitting: Neurology

## 2017-12-13 ENCOUNTER — Other Ambulatory Visit: Payer: Self-pay | Admitting: Neurology

## 2017-12-21 ENCOUNTER — Encounter: Payer: Self-pay | Admitting: Neurology

## 2018-01-12 ENCOUNTER — Other Ambulatory Visit: Payer: Self-pay

## 2018-01-12 MED ORDER — GABAPENTIN 600 MG PO TABS: 600.0000 mg | ORAL_TABLET | Freq: Every day | ORAL | 1 refills | Status: DC

## 2018-01-27 ENCOUNTER — Ambulatory Visit: Payer: BLUE CROSS/BLUE SHIELD | Admitting: Neurology

## 2018-11-12 ENCOUNTER — Telehealth: Payer: Self-pay | Admitting: *Deleted

## 2018-11-12 NOTE — Telephone Encounter (Signed)
Per vo by Dr. Epimenio FootSater, he will be out of the office next week.  Please place on NP schedule.  The patient is agreeable and has been scheduled with Darrol Angelarolyn Martin, NP in an available slot on 11/15/18.   Emails from patient:   Yes, I think that's my scheduled appointment time already. Is that the soonest available time? My two fingers are still bothering me. It sometimes tingles or gets a little numb. I was reading that permanent damage can occur if not treated immediately.  ----- Message -----  From: Nurse Ivy LynnEmma L K  Sent: 11/11/2018 7:44 AM EST  To: [name]  Subject: RE: Non-Urgent Medical Question  Good morning-    I was reviewing your chart and it looks like you were last seen 08/24/17. Dr. Epimenio FootSater likes to see you every 6 months. Could you come on 12/08/18 at 4pm for a follow up?    Just let me know!  Emma,RN      ----- Message -----   From: Brianna Sanders   Sent: 11/09/2018 12:20 PM EST    To: Asa Lenteichard A Sater, MD  Subject: RE: Non-Urgent Medical Question    Hi,    I'm having constant pain in my left hand (pinkie and ring finger). It's been going on for about 5 days and I'm not sure what's going on. Just wanted to make some sort of record of it. Thank you

## 2018-11-14 NOTE — Progress Notes (Addendum)
[facility]  PATIENT: Brianna Sanders DOB: 03/24/1986   REASON FOR VISIT: follow up for MS HISTORY FROM:    HISTORY OF PRESENT ILLNESS:UPDATE 12/30/2019CM Ms. Figgs, 33 year old female returns for follow-up with history of multiple sclerosis.  She is currently on ocrelizumab every 6 months.  Her last infusion was 09/2018.  Patient is a Financial controller for [employer].  She called in to be seen due to intermittent pain in the fourth and fifth finger on her left hand only.  She had reported numbness in both hands previously in the past.  She originally noticed it getting out of the shower 1 day.  She denies any specific neck pain although she has had trigger point injections in the past to her neck.  Her restless legs are no longer an issue.  Last MRI of the brain 08/26/2017 shows  1.    Multiple T2/FLAIR hyperintense foci in the periventricular, juxtacortical and deep white matter in a pattern and configuration consistent with chronic demyelinating plaque associated with multiple sclerosis. None of the foci appears to be acute. When compared to the MRI dated 12/11/2016, there are no new lesions. 2.     There is a normal enhancement pattern. The several small enhancing lesions noted on the 12/11/2016 MRI no longer enhance. She returns for reevaluation   Update 08/24/2017:     She had her first 1/2 dose of ocrelizumab 02/18/2017 and the second half of the dose 2 weeks later. She is now due for her next dose (6 months) but her insurance company has not yet given the go-ahead.   She tolerated the infusions well. She has not had any exacerbations since starting ocrelizumab.    The neck pain that she was experiencing at the last follow-up is better. The trigger point injection helped. She continues to have leg cramps that are bothersome at night.  Ropinirole was not tolerated. Flexeril helped mildly.   Her gait is usually good but she sometimes loses balance.   The  right leg is mildly weak at times but she denies foot drop, even if she walks a long distance.     She notes a little tingling and numbness in her hands, but nothing troubling.     Vision is doing better but she continues to have mild duties visual acuity and altered color vision out of the left eye since her optic neuritis earlier this year.   There is no diplopia..   Bladder function is good.     She felt some fatigue over the summer, worse with heat.      She sleeps well most nights but sometimes the cramps keep her from falling asleep.   She denies any depression or anxiety.   She feels cognition is doing well though sometimes she is mildly forgetful.    ________________________-- From 04/03/2017:  She woke up fine Saturday morning but had more and more lower neck/upper back pain as the day went on.   Her neck seems stiff.    She denies any significant new numbness, weakness or change in gait.     No change in bladder.    Pain is worse with laying down and has bene worse at bedtime.     She denies any new numbness, weakness, change in balance/gait or change in bladder.  MS:   She started ocrelizumab April 2018.   She tolerated it fairly well and just had a headache the second dose.      MS History:  On 11/30/2016, she had the onset of blurred vision and pain behind the left eye. She saw one ophthalmologist and told she had iritis and she was prescribed eye drops.    She saw another ophthalmologist 12/11/2016 and was referred to [facility] emergency room for further management of optic neuritis and further diagnostic workup.    An MRI of the brain showed multiple lesions consistent with MS and she was admitted for IV Solu-Medrol.  ANA, Lyme, RPR and HIV were negative. TSH was mildly low.  After the steroids, she felt that the vision improved quite a bit though it is still not back to baseline.    She does not recall any other episode of neurologic symptoms lasting over a day.    I reviewed  the MRI of the brain and orbits performed 12/11/2016. The MRI of the orbits shows enhancement of the left optic nerve. The nerve is also hyperintense on 10 T2 weighted images. The MRI of the brain shows increased signal on diffusion-weighted images with in the optic nerve. Multiple T2/FLAIR hyperintense foci, mostly in the periventricular white matter with some of the foci are radially oriented to the ventricles. One left juxtacortical foci is also seen.   Four or five of the foci enhanced, including 2 of periventricular foci and the one left juxtacortical focus.     She started ocrelizumab April 2018.   REVIEW OF SYSTEMS: Full 14 system review of systems performed and notable only for those listed, all others are neg:  Constitutional: neg  Cardiovascular: neg Ear/Nose/Throat: neg  Skin: neg Eyes: neg Respiratory: neg Gastroitestinal: neg  Hematology/Lymphatic: neg  Endocrine: neg Musculoskeletal:neg Allergy/Immunology: neg Neurological: Numbness in left fourth and fifth finger Psychiatric: neg Sleep : neg   ALLERGIES: No Known Allergies  HOME MEDICATIONS: Outpatient Medications Prior to Visit  Medication Sig Dispense Refill  . ibuprofen (ADVIL,MOTRIN) 800 MG tablet Take 1 tablet (800 mg total) by mouth every 8 (eight) hours as needed for cramping. 30 tablet 11  . levocetirizine (XYZAL) 5 MG tablet Take 1 tablet (5 mg total) by mouth every evening. (Patient taking differently: Take 5 mg by mouth daily as needed. ) 90 tablet 3  . ocrelizumab 600 mg in sodium chloride 0.9 % 500 mL Inject 600 mg into the vein every 6 (six) months.    . cyclobenzaprine (FLEXERIL) 5 MG tablet TAKE 1 TABLET (5 MG TOTAL) BY MOUTH EVERY 8 (EIGHT) HOURS AS NEEDED FOR MUSCLE SPASMS. (Patient not taking: Reported on 11/15/2018) 90 tablet 0  . gabapentin (NEURONTIN) 600 MG tablet Take 1 tablet (600 mg total) by mouth at bedtime. (Patient not taking: Reported on 11/15/2018) 90 tablet 1  . scopolamine  (TRANSDERM-SCOP) 1 MG/3DAYS Place onto the skin.    Marland Kitchen. scopolamine (TRANSDERM-SCOP) 1 MG/3DAYS Place 1 patch (1.5 mg total) onto the skin every 3 (three) days. 10 patch 1   Facility-Administered Medications Prior to Visit  Medication Dose Route Frequency Provider Last Rate Last Dose  . gadopentetate dimeglumine (MAGNEVIST) injection 14 mL  14 mL Intravenous Once PRN Sater, Pearletha Furlichard A, MD        PAST MEDICAL HISTORY: Past Medical History:  Diagnosis Date  . Palpitations   . Vision abnormalities     PAST SURGICAL HISTORY: History reviewed. No pertinent surgical history.  FAMILY HISTORY: Family History  Problem Relation Age of Onset  . Healthy Mother   . Healthy Father     SOCIAL HISTORY: Social History   Socioeconomic History  .  Marital status: Unknown    Spouse name: Not on file  . Number of children: Not on file  . Years of education: Not on file  . Highest education level: Not on file  Occupational History  . Not on file  Social Needs  . Financial resource strain: Not on file  . Food insecurity:    Worry: Not on file    Inability: Not on file  . Transportation needs:    Medical: Not on file    Non-medical: Not on file  Tobacco Use  . Smoking status: Former Smoker    Last attempt to quit: 01/14/2012    Years since quitting: 6.8  . Smokeless tobacco: Never Used  Substance and Sexual Activity  . Alcohol use: Yes  . Drug use: No  . Sexual activity: Yes    Birth control/protection: Condom  Lifestyle  . Physical activity:    Days per week: Not on file    Minutes per session: Not on file  . Stress: Not on file  Relationships  . Social connections:    Talks on phone: Not on file    Gets together: Not on file    Attends religious service: Not on file    Active member of club or organization: Not on file    Attends meetings of clubs or organizations: Not on file    Relationship status: Not on file  . Intimate partner violence:    Fear of current or ex partner:  Not on file    Emotionally abused: Not on file    Physically abused: Not on file    Forced sexual activity: Not on file  Other Topics Concern  . Not on file  Social History Narrative  . Not on file     PHYSICAL EXAM  Vitals:   11/15/18 0804  BP: 113/67  Pulse: (!) 101  Weight: 158 lb 12.8 oz (72 kg)  Height: 5\' 5"  (1.651 m)   Body mass index is 26.43 kg/m.  Generalized: Well developed, in no acute distress  Head: normocephalic and atraumatic,. Oropharynx benign  Neck: Supple,  Musculoskeletal: No deformity   Neurological examination   Mentation: Alert oriented to time, place, history taking. Attention span and concentration appropriate. Recent and remote memory intact.  Follows all commands speech and language fluent.   Cranial nerve II-XII: Visual acuity 20/20 bilaterally.  Left APD. , extraocular movements were full, visual field were full on confrontational test. Facial sensation and strength were normal. hearing was intact to finger rubbing bilaterally. Uvula tongue midline. head turning and shoulder shrug were normal and symmetric.Tongue protrusion into cheek strength was normal. Motor: normal bulk and tone, full strength in the BUE, BLE,  Sensory: normal and symmetric to light touch, pinprick, and  Vibration, in the upper and lower extremities Coordination: finger-nose-finger, heel-to-shin bilaterally, no dysmetria Reflexes: Brachioradialis 2/2, biceps 2/2, triceps 2/2, patellar 3/3, Achilles 2/2, plantar responses were flexor bilaterally. Gait and Station: Rising up from seated position without assistance, normal stance,  moderate stride, good arm swing, smooth turning, able to perform tiptoe, and heel walking without difficulty. Tandem gait is steady  DIAGNOSTIC DATA (LABS, IMAGING, TESTING) - I reviewed patient records, labs, notes, testing and imaging myself where available.  Lab Results  Component Value Date   WBC 4.7 08/24/2017   HGB 12.0 08/24/2017   HCT  38.8 08/24/2017   MCV 91 08/24/2017   PLT 284 08/24/2017      Component Value Date/Time   NA 141 12/13/2016  0757   K 3.7 12/13/2016 0757   CL 109 12/13/2016 0757   CO2 23 12/13/2016 0757   GLUCOSE 134 (H) 12/13/2016 0757   BUN 10 12/13/2016 0757   CREATININE 0.83 12/13/2016 0757   CALCIUM 9.9 12/13/2016 0757   PROT 6.5 08/24/2017 1159   ALBUMIN 4.5 08/24/2017 1159   AST 14 08/24/2017 1159   ALT 7 08/24/2017 1159   ALKPHOS 52 08/24/2017 1159   BILITOT 0.3 08/24/2017 1159   GFRNONAA >60 12/13/2016 0757   GFRAA >60 12/13/2016 0757    Lab Results  Component Value Date   VITAMINB12 787 12/13/2016   Lab Results  Component Value Date   TSH 1.250 01/12/2017      ASSESSMENT AND PLAN  32 y.o. year old female  has a past medical history of Palpitations and Vision abnormalities. here here to follow-up for multiple sclerosis.  She had last dose of  ocrelizumab November 2019.  Her first dose was 02/18/2017.    She has new complaint of numbness in the left hand.  Last MRI October 2018.  The patient is a current patient of Dr. Epimenio Foot  who is out of the office today . This note is sent to the work in doctor.     PLAN: Continue Ocrevus infusion every 6 months CBC CMP today IgG, IgA, IgM, MRI of the brain and cervical spine to follow progression of the disease symptoms of numbness Follow-up with Dr. Epimenio Foot as already scheduled Nilda Riggs, [facility], [facility], APRN  [facility] [address] [place], [place] [address] [phone]  I reviewed the above note and documentation by the Nurse Practitioner and agree with the history, physical exam, assessment and plan as outlined above. I was immediately available for face-to-face consultation. Huston Foley, MD, PhD [facility])

## 2018-11-15 ENCOUNTER — Encounter: Payer: Self-pay | Admitting: Nurse Practitioner

## 2018-11-15 ENCOUNTER — Ambulatory Visit: Payer: BLUE CROSS/BLUE SHIELD | Admitting: Nurse Practitioner

## 2018-11-15 VITALS — BP 113/67 | HR 101 | Ht 65.0 in | Wt 158.8 lb

## 2018-11-15 DIAGNOSIS — G35 Multiple sclerosis: Secondary | ICD-10-CM | POA: Diagnosis not present

## 2018-11-15 DIAGNOSIS — R200 Anesthesia of skin: Secondary | ICD-10-CM | POA: Insufficient documentation

## 2018-11-15 DIAGNOSIS — Z5181 Encounter for therapeutic drug level monitoring: Secondary | ICD-10-CM | POA: Diagnosis not present

## 2018-11-15 DIAGNOSIS — R202 Paresthesia of skin: Secondary | ICD-10-CM

## 2018-11-15 NOTE — Patient Instructions (Signed)
Continue Ocrevus infusion every 6 months CBC CMP today MRI of the brain and cervical spine Follow-up with Dr. Epimenio FootSater as already scheduled

## 2018-11-16 LAB — COMPREHENSIVE METABOLIC PANEL
A/G RATIO: 2.5 — AB (ref 1.2–2.2)
ALT: 7 IU/L (ref 0–32)
AST: 13 IU/L (ref 0–40)
Albumin: 4.5 g/dL (ref 3.5–5.5)
Alkaline Phosphatase: 80 IU/L (ref 39–117)
BUN/Creatinine Ratio: 11 (ref 9–23)
BUN: 12 mg/dL (ref 6–20)
Bilirubin Total: 0.2 mg/dL (ref 0.0–1.2)
CO2: 19 mmol/L — ABNORMAL LOW (ref 20–29)
Calcium: 9.7 mg/dL (ref 8.7–10.2)
Chloride: 106 mmol/L (ref 96–106)
Creatinine, Ser: 1.05 mg/dL — ABNORMAL HIGH (ref 0.57–1.00)
GFR calc Af Amer: 81 mL/min/{1.73_m2} (ref >59)
GFR calc non Af Amer: 70 mL/min/{1.73_m2} (ref >59)
Globulin, Total: 1.8 g/dL (ref 1.5–4.5)
Glucose: 103 mg/dL — ABNORMAL HIGH (ref 65–99)
POTASSIUM: 4.6 mmol/L (ref 3.5–5.2)
Sodium: 141 mmol/L (ref 134–144)
Total Protein: 6.3 g/dL (ref 6.0–8.5)

## 2018-11-16 LAB — CBC WITH DIFFERENTIAL/PLATELET
Basophils Absolute: 0 10*3/uL (ref 0.0–0.2)
Basos: 1 %
EOS (ABSOLUTE): 0.1 10*3/uL (ref 0.0–0.4)
Eos: 2 %
Hematocrit: 38.4 % (ref 34.0–46.6)
Hemoglobin: 12.5 g/dL (ref 11.1–15.9)
IMMATURE GRANS (ABS): 0 10*3/uL (ref 0.0–0.1)
Immature Granulocytes: 1 %
Lymphocytes Absolute: 1.8 10*3/uL (ref 0.7–3.1)
Lymphs: 32 %
MCH: 29.6 pg (ref 26.6–33.0)
MCHC: 32.6 g/dL (ref 31.5–35.7)
MCV: 91 fL (ref 79–97)
Monocytes Absolute: 0.6 10*3/uL (ref 0.1–0.9)
Monocytes: 10 %
Neutrophils Absolute: 3.2 10*3/uL (ref 1.4–7.0)
Neutrophils: 54 %
PLATELETS: 270 10*3/uL (ref 150–450)
RBC: 4.23 x10E6/uL (ref 3.77–5.28)
RDW: 12.4 % (ref 12.3–15.4)
WBC: 5.7 10*3/uL (ref 3.4–10.8)

## 2018-11-16 LAB — IGG, IGA, IGM
IgA/Immunoglobulin A, Serum: 53 mg/dL — ABNORMAL LOW (ref 87–352)
IgG (Immunoglobin G), Serum: 685 mg/dL — ABNORMAL LOW (ref 700–1600)
IgM (Immunoglobulin M), Srm: 34 mg/dL (ref 26–217)

## 2018-11-18 ENCOUNTER — Telehealth: Payer: Self-pay | Admitting: Nurse Practitioner

## 2018-11-18 DIAGNOSIS — G35 Multiple sclerosis: Secondary | ICD-10-CM

## 2018-11-18 DIAGNOSIS — R200 Anesthesia of skin: Secondary | ICD-10-CM

## 2018-11-18 DIAGNOSIS — R202 Paresthesia of skin: Secondary | ICD-10-CM

## 2018-11-18 NOTE — Addendum Note (Signed)
Addended by: Guy Begin on: 11/18/2018 10:05 AM   Modules accepted: Orders

## 2018-11-18 NOTE — Telephone Encounter (Signed)
When you get a chance can you put a new order in for a MR Cervical spine either with and without contrast or just without contrast.. they won't do just with contrast.

## 2018-11-18 NOTE — Telephone Encounter (Signed)
Noted, thank you

## 2018-11-18 NOTE — Telephone Encounter (Signed)
done

## 2018-11-19 NOTE — Telephone Encounter (Signed)
lvm for pt to call back about scheduling mri  °

## 2018-11-19 NOTE — Telephone Encounter (Signed)
Pt returning Emily's call  °

## 2018-11-19 NOTE — Telephone Encounter (Signed)
lvm for pt to call back about scheduling mri  [employer] Auth: [id number] (exp. 11/18/18 to 12/17/18)

## 2018-11-23 ENCOUNTER — Telehealth: Payer: Self-pay | Admitting: *Deleted

## 2018-11-23 NOTE — Telephone Encounter (Signed)
Spoke to the patient I informed her that we do not do the MRI's here on Thurs & Fii and that she an go to GI. She stated she would like to go to GI I told her I would send the order there and they should be giving her a call in the next 2-3 business days.

## 2018-11-23 NOTE — Telephone Encounter (Signed)
LVM informing the patient that her labs are okay except for a  mildly elevated creatinine. Advised her to stay well-hydrated with water. Left number for any questions.

## 2018-11-23 NOTE — Telephone Encounter (Signed)
Pt has returned call to [facility], she is asking for a call back.  Pt states if there is anything available for Thurs or Friday please schedule and call back with time

## 2018-11-26 ENCOUNTER — Telehealth: Payer: Self-pay | Admitting: Neurology

## 2018-11-26 NOTE — Telephone Encounter (Signed)
Pt is wanting to know if she can come to our office to get her MRI instead of going to GI due to work schedule changing. Please advise.

## 2018-11-29 NOTE — Telephone Encounter (Signed)
lvm for patient to call back about scheduling mri.

## 2018-11-29 NOTE — Telephone Encounter (Signed)
Spoke to the patient she is scheduled for 12/07/18 at [facility].

## 2018-11-29 NOTE — Telephone Encounter (Signed)
Spoke to the patient she is scheduled for 12/07/18 at [facility].  °

## 2018-12-07 ENCOUNTER — Ambulatory Visit: Payer: BLUE CROSS/BLUE SHIELD

## 2018-12-07 DIAGNOSIS — R200 Anesthesia of skin: Secondary | ICD-10-CM | POA: Diagnosis not present

## 2018-12-07 DIAGNOSIS — G35 Multiple sclerosis: Secondary | ICD-10-CM

## 2018-12-07 DIAGNOSIS — R202 Paresthesia of skin: Secondary | ICD-10-CM

## 2018-12-07 MED ORDER — GADOBENATE DIMEGLUMINE 529 MG/ML IV SOLN
14.0000 mL | Freq: Once | INTRAVENOUS | Status: AC | PRN
  Administered 2018-12-07: 14 mL via INTRAVENOUS

## 2018-12-14 ENCOUNTER — Telehealth: Payer: Self-pay | Admitting: *Deleted

## 2018-12-14 NOTE — Telephone Encounter (Signed)
LMVM for pt that calling re: tests results.  Please call back at her convenience.

## 2018-12-14 NOTE — Telephone Encounter (Signed)
-----   Message from Nilda Riggs, NP sent at 12/10/2018  8:08 AM EST ----- MRI of the brain consistent with chronic demyelinating plaques no acute plaques no change from MRI October 2018.  Please call the patient

## 2018-12-15 ENCOUNTER — Ambulatory Visit: Payer: BLUE CROSS/BLUE SHIELD | Admitting: Neurology

## 2018-12-15 ENCOUNTER — Other Ambulatory Visit: Payer: Self-pay

## 2018-12-15 ENCOUNTER — Encounter

## 2018-12-15 ENCOUNTER — Encounter: Payer: Self-pay | Admitting: Neurology

## 2018-12-15 VITALS — BP 114/80 | HR 93 | Ht 65.0 in | Wt 155.5 lb

## 2018-12-15 DIAGNOSIS — Z79899 Other long term (current) drug therapy: Secondary | ICD-10-CM | POA: Diagnosis not present

## 2018-12-15 DIAGNOSIS — R202 Paresthesia of skin: Secondary | ICD-10-CM

## 2018-12-15 DIAGNOSIS — H469 Unspecified optic neuritis: Secondary | ICD-10-CM | POA: Diagnosis not present

## 2018-12-15 DIAGNOSIS — R200 Anesthesia of skin: Secondary | ICD-10-CM | POA: Diagnosis not present

## 2018-12-15 DIAGNOSIS — G35 Multiple sclerosis: Secondary | ICD-10-CM | POA: Diagnosis not present

## 2018-12-15 NOTE — Telephone Encounter (Signed)
Results per Dr. Epimenio Foot when pt in for appt today.

## 2018-12-15 NOTE — Progress Notes (Signed)
[facility]  PATIENT: Brianna Sanders DOB: 04-29-1986  REFERRING DOCTOR OR PCP:  Hope Pigeon SOURCE: patient, notes from ED, admission and PCP, MRI and lab reports, MRI images on PACS  _________________________________   HISTORICAL  CHIEF COMPLAINT:  Chief Complaint  Patient presents with  . Follow-up    RM 12, alone. Last seen 11/15/18. Denies any new sx. Has some light sensitivity.   . Multiple Sclerosis    On Ocrevus. Last infusion: 09/2018    HISTORY OF PRESENT ILLNESS:   Brianna Sanders is a 33 y.o. woman with MS.  Update 12/15/2018: She is on Ocrevus since 02/18/2017 and tolerates it well.  She had no infusion reactions.  Last infusion was in November 2019.   She had no infusion  She denies any exacerbations.   MRI brain and cervical spine showed no new lesions in brain and no c-spine lesions (12/07/2018).     She feels gait is doing well.   No falls or stumble.   She can climb a ladder.   Strength is doing well and no spasticity.    She denies numbness or tingling for the most part but the 4th and 5th finger on her left sometimes tingle and have pain.   Bladder is doing ok without frequency or hesitancy though she had one UTI last year.     Vision is doing well now and colors are symmetric (left ON was first symptom).   Fatigue is not a problem and she sleeps well.   Mood and cognition are doing well.     Update 08/25/2027:     She had her first 1/2 dose of ocrelizumab 02/18/2017 and the second half of the dose 2 weeks later. She is now due for her next dose (6 months) but her insurance company has not yet given the go-ahead.   She tolerated the infusions well. She has not had any exacerbations since starting ocrelizumab.    The neck pain that she was experiencing at the last follow-up is better. The trigger point injection helped. She continues to have leg cramps that are bothersome at night.  Ropinirole was not tolerated. Flexeril helped mildly.    Her gait is usually good but she sometimes loses balance.   The right leg is mildly weak at times but she denies foot drop, even if she walks a long distance.     She notes a little tingling and numbness in her hands, but nothing troubling.     Vision is doing better but she continues to have mild duties visual acuity and altered color vision out of the left eye since her optic neuritis earlier this year.   There is no diplopia..   Bladder function is good.     She felt some fatigue over the summer, worse with heat.      She sleeps well most nights but sometimes the cramps keep her from falling asleep.   She denies any depression or anxiety.   She feels cognition is doing well though sometimes she is mildly forgetful.    ________________________-- From 04/03/2017:  She woke up fine Saturday morning but had more and more lower neck/upper back pain as the day went on.   Her neck seems stiff.    She denies any significant new numbness, weakness or change in gait.     No change in bladder.    Pain is worse with laying down and has bene worse at bedtime.     She denies  any new numbness, weakness, change in balance/gait or change in bladder.  MS:   She started ocrelizumab April 2018.   She tolerated it fairly well and just had a headache the second dose.      MS History:  On 11/30/2016, she had the onset of blurred vision and pain behind the left eye. She saw one ophthalmologist and told she had iritis and she was prescribed eye drops.    She saw another ophthalmologist 12/11/2016 and was referred to the [facility] emergency room for further management of optic neuritis and further diagnostic workup.    An MRI of the brain showed multiple lesions consistent with MS and she was admitted for IV Solu-Medrol.  ANA, Lyme, RPR and HIV were negative. TSH was mildly low.  After the steroids, she felt that the vision improved quite a bit though it is still not back to baseline.    She does not recall any other  episode of neurologic symptoms lasting over a day.    I reviewed the MRI of the brain and orbits performed 12/11/2016. The MRI of the orbits shows enhancement of the left optic nerve. The nerve is also hyperintense on 10 T2 weighted images. The MRI of the brain shows increased signal on diffusion-weighted images with in the optic nerve. Multiple T2/FLAIR hyperintense foci, mostly in the periventricular white matter with some of the foci are radially oriented to the ventricles. One left juxtacortical foci is also seen.   Four or five of the foci enhanced, including 2 of periventricular foci and the one left juxtacortical focus.     She started ocrelizumab April 2018.  REVIEW OF SYSTEMS: Constitutional: No fevers, chills, sweats, or change in appetite Eyes: see above. Ear, nose and throat: No hearing loss, ear pain, nasal congestion, sore throat Cardiovascular: No chest pain.   She has had recent palpitations (recent normal Holter) Respiratory: No shortness of breath at rest or with exertion.   No wheezes GastrointestinaI: No nausea, vomiting, diarrhea, abdominal pain, fecal incontinence Genitourinary: No dysuria, urinary retention or frequency.  No nocturia. Musculoskeletal: No neck pain, back pain Integumentary: No rash, pruritus, skin lesions Neurological: as above Psychiatric: No depression at this time.  No anxiety Endocrine: No palpitations, diaphoresis, change in appetite, change in weigh or increased thirst Hematologic/Lymphatic: No anemia, purpura, petechiae. Allergic/Immunologic: No itchy/runny eyes, nasal congestion, recent allergic reactions, rashes  ALLERGIES: No Known Allergies  HOME MEDICATIONS:  Current Outpatient Medications:  .  ibuprofen (ADVIL,MOTRIN) 800 MG tablet, Take 1 tablet (800 mg total) by mouth every 8 (eight) hours as needed for cramping., Disp: 30 tablet, Rfl: 11 .  levocetirizine (XYZAL) 5 MG tablet, Take 1 tablet (5 mg total) by mouth every evening.  (Patient taking differently: Take 5 mg by mouth daily as needed. ), Disp: 90 tablet, Rfl: 3 .  ocrelizumab 600 mg in sodium chloride 0.9 % 500 mL, Inject 600 mg into the vein every 6 (six) months., Disp: , Rfl:  .  scopolamine (TRANSDERM-SCOP) 1 MG/3DAYS, Place onto the skin as needed. , Disp: , Rfl:  No current facility-administered medications for this visit.   Facility-Administered Medications Ordered in Other Visits:  .  gadopentetate dimeglumine (MAGNEVIST) injection 14 mL, 14 mL, Intravenous, Once PRN, Sater, Pearletha Furl, MD  PAST MEDICAL HISTORY: Past Medical History:  Diagnosis Date  . Palpitations   . Vision abnormalities     PAST SURGICAL HISTORY: History reviewed. No pertinent surgical history.  FAMILY HISTORY: Family History  Problem Relation  Age of Onset  . Healthy Mother   . Healthy Father     SOCIAL HISTORY:  Social History   Socioeconomic History  . Marital status: Unknown    Spouse name: Not on file  . Number of children: Not on file  . Years of education: Not on file  . Highest education level: Not on file  Occupational History  . Not on file  Social Needs  . Financial resource strain: Not on file  . Food insecurity:    Worry: Not on file    Inability: Not on file  . Transportation needs:    Medical: Not on file    Non-medical: Not on file  Tobacco Use  . Smoking status: Former Smoker    Last attempt to quit: 01/14/2012    Years since quitting: 6.9  . Smokeless tobacco: Never Used  Substance and Sexual Activity  . Alcohol use: Yes  . Drug use: No  . Sexual activity: Yes    Birth control/protection: Condom  Lifestyle  . Physical activity:    Days per week: Not on file    Minutes per session: Not on file  . Stress: Not on file  Relationships  . Social connections:    Talks on phone: Not on file    Gets together: Not on file    Attends religious service: Not on file    Active member of club or organization: Not on file    Attends meetings  of clubs or organizations: Not on file    Relationship status: Not on file  . Intimate partner violence:    Fear of current or ex partner: Not on file    Emotionally abused: Not on file    Physically abused: Not on file    Forced sexual activity: Not on file  Other Topics Concern  . Not on file  Social History Narrative  . Not on file     PHYSICAL EXAM  Vitals:   12/15/18 1056  BP: 114/80  Pulse: 93  Weight: 155 lb 8 oz (70.5 kg)  Height: 5\' 5"  (1.651 m)    Body mass index is 25.88 kg/m.   General: The patient is well-developed and well-nourished and in no acute distress   Neurologic Exam  Mental status: The patient is alert and oriented x 3 at the time of the examination. The patient has apparent normal recent and remote memory, with an apparently normal attention span and concentration ability.   Speech is normal.  Cranial nerves: Extraocular movements are full. She has a 1+ APD.   Color vision is now symmetric. Facial strength and sensation was normal. Trapezius strength was normal.   The tongue is midline, and the patient has symmetric elevation of the soft palate. No obvious hearing deficits are noted.  Motor:  Muscle bulk is normal.   Tone is normal. Strength is  5 / 5 in all 4 extremities.   Sensory: Intact touch and vibration.  Coordination: Cerebellar testing shows good finger-nose-finger and heel-to-shin bilaterally..  Gait and station: Station is normal.   Gait is normal. Tandem gait is normal. Romberg is negative.   Reflexes: Deep tendon reflexes are symmetric and normal in arms. DTRs are 3+ at the knees and 2 at the ankles.Marland Kitchen.        DIAGNOSTIC DATA (LABS, IMAGING, TESTING) - I reviewed patient records, labs, notes, testing and imaging myself where available.  Lab Results  Component Value Date   WBC 5.7 11/15/2018   HGB 12.5  11/15/2018   HCT 38.4 11/15/2018   MCV 91 11/15/2018   PLT 270 11/15/2018      Component Value Date/Time   NA 141  11/15/2018 0850   K 4.6 11/15/2018 0850   CL 106 11/15/2018 0850   CO2 19 (L) 11/15/2018 0850   GLUCOSE 103 (H) 11/15/2018 0850   GLUCOSE 134 (H) 12/13/2016 0757   BUN 12 11/15/2018 0850   CREATININE 1.05 (H) 11/15/2018 0850   CALCIUM 9.7 11/15/2018 0850   PROT 6.3 11/15/2018 0850   ALBUMIN 4.5 11/15/2018 0850   AST 13 11/15/2018 0850   ALT 7 11/15/2018 0850   ALKPHOS 80 11/15/2018 0850   BILITOT 0.2 11/15/2018 0850   GFRNONAA 70 11/15/2018 0850   GFRAA 81 11/15/2018 0850   No results found for: CHOL, HDL, LDLCALC, LDLDIRECT, TRIG, CHOLHDL No results found for: ZOXW9UHGBA1C Lab Results  Component Value Date   VITAMINB12 787 12/13/2016   Lab Results  Component Value Date   TSH 1.250 01/12/2017       ASSESSMENT AND PLAN   Multiple sclerosis (HCC)  Optic neuritis  High risk medication use  Numbness and tingling in left hand   1.  Continue OCrevus.    IgG was slightly low and we will recheck at the next visit.  2.  Stay active and exercise as needed. 3.    rtc 6 months, sooner if problems      Richard A. Epimenio FootSater, MD, PhD 12/15/2018, 12:18 PM Certified in Neurology, Clinical Neurophysiology, Sleep Medicine, Pain Medicine and Neuroimaging  [facility] [address], [address] [place], [place] [id number] [phone]

## 2019-03-16 DIAGNOSIS — N390 Urinary tract infection, site not specified: Secondary | ICD-10-CM | POA: Diagnosis not present

## 2019-05-04 DIAGNOSIS — R8290 Unspecified abnormal findings in urine: Secondary | ICD-10-CM | POA: Diagnosis not present

## 2019-05-04 DIAGNOSIS — Z1159 Encounter for screening for other viral diseases: Secondary | ICD-10-CM | POA: Diagnosis not present

## 2019-05-04 DIAGNOSIS — N760 Acute vaginitis: Secondary | ICD-10-CM | POA: Diagnosis not present

## 2019-05-04 DIAGNOSIS — T8332XA Displacement of intrauterine contraceptive device, initial encounter: Secondary | ICD-10-CM | POA: Diagnosis not present

## 2019-05-04 DIAGNOSIS — Z114 Encounter for screening for human immunodeficiency virus [HIV]: Secondary | ICD-10-CM | POA: Diagnosis not present

## 2019-05-04 DIAGNOSIS — Z113 Encounter for screening for infections with a predominantly sexual mode of transmission: Secondary | ICD-10-CM | POA: Diagnosis not present

## 2019-05-04 DIAGNOSIS — B373 Candidiasis of vulva and vagina: Secondary | ICD-10-CM | POA: Diagnosis not present

## 2019-05-04 DIAGNOSIS — N930 Postcoital and contact bleeding: Secondary | ICD-10-CM | POA: Diagnosis not present

## 2019-05-04 DIAGNOSIS — Z118 Encounter for screening for other infectious and parasitic diseases: Secondary | ICD-10-CM | POA: Diagnosis not present

## 2019-05-05 ENCOUNTER — Telehealth: Payer: Self-pay | Admitting: *Deleted

## 2019-05-05 DIAGNOSIS — G35 Multiple sclerosis: Secondary | ICD-10-CM | POA: Diagnosis not present

## 2019-05-05 NOTE — Telephone Encounter (Signed)
Received notification via mail from [employer] [place] that [employer] approved 04/25/19-04/24/20. Procedure code: V4712. Pt ID: [id number]. Request ID: [id number]. Gave to intrafusion for their records.

## 2019-05-19 DIAGNOSIS — Z124 Encounter for screening for malignant neoplasm of cervix: Secondary | ICD-10-CM | POA: Diagnosis not present

## 2019-05-19 DIAGNOSIS — Z01419 Encounter for gynecological examination (general) (routine) without abnormal findings: Secondary | ICD-10-CM | POA: Diagnosis not present

## 2019-05-19 DIAGNOSIS — R87619 Unspecified abnormal cytological findings in specimens from cervix uteri: Secondary | ICD-10-CM | POA: Diagnosis not present

## 2019-05-19 DIAGNOSIS — T8332XA Displacement of intrauterine contraceptive device, initial encounter: Secondary | ICD-10-CM | POA: Diagnosis not present

## 2019-05-19 DIAGNOSIS — Z1151 Encounter for screening for human papillomavirus (HPV): Secondary | ICD-10-CM | POA: Diagnosis not present

## 2019-05-19 DIAGNOSIS — N930 Postcoital and contact bleeding: Secondary | ICD-10-CM | POA: Diagnosis not present

## 2019-05-19 DIAGNOSIS — Z30432 Encounter for removal of intrauterine contraceptive device: Secondary | ICD-10-CM | POA: Diagnosis not present

## 2019-06-15 ENCOUNTER — Encounter: Payer: Self-pay | Admitting: Neurology

## 2019-06-15 ENCOUNTER — Ambulatory Visit (INDEPENDENT_AMBULATORY_CARE_PROVIDER_SITE_OTHER): Payer: BC Managed Care – PPO | Admitting: Neurology

## 2019-06-15 ENCOUNTER — Other Ambulatory Visit: Payer: Self-pay

## 2019-06-15 VITALS — BP 99/59 | HR 92 | Temp 98.4°F | Ht 65.0 in | Wt 141.5 lb

## 2019-06-15 DIAGNOSIS — G35 Multiple sclerosis: Secondary | ICD-10-CM

## 2019-06-15 DIAGNOSIS — Z79899 Other long term (current) drug therapy: Secondary | ICD-10-CM | POA: Diagnosis not present

## 2019-06-15 NOTE — Progress Notes (Signed)
[facility]  PATIENT: Brianna Sanders DOB: 12/18/1985  REFERRING DOCTOR OR PCP:  Brianna Sanders SOURCE: patient, notes from ED, admission and PCP, MRI and lab reports, MRI images on PACS  _________________________________   HISTORICAL  CHIEF COMPLAINT:  Chief Complaint  Patient presents with  . Follow-up    RM 12, alone. Last seen 12/15/18. No new sx. She has been feeling more depressed off and on. Denies any significant stressors in her life.  . Multiple Sclerosis    On Ocrevus. Last infusion:05/05/2019. Next one scheduled for 11/08/2019 at 10:30am per Brianna StarrLiane, RN (intrafusion).  Her IgG needs rechecking d/t low value last time.     HISTORY OF PRESENT ILLNESS:   Brianna Sanders is a 33 y.o. woman with MS.  Update 06/15/19: She is on Ocrevus and tolerates it well.   Her last infusion was 04/2019.    Her last exacerbation was at diagnosis 11/2016.    She has been on Ocrevus since 02/2017.  MRIs  12/07/18 showed no new MS lesions in the brain and a normal spinal cord.     She is walking and riding hr bike frequently.   She sometimes gets cramps when she exercises, less if she stretcher.   Balance is fine and she can go up and down stairs without holding on.   Strength and sensation are doing well.    She had a few UTIs and saw urology.   She was emptying well so no med's were needed.      She denies much fatigue.    She sleeps well.   She notes some depression but would like to avoid medications.   Cognition is doing ok though she is occasional forgetful.      Update 12/15/2018: She is on Ocrevus since 02/18/2017 and tolerates it well.  She had no infusion reactions.  Last infusion was in November 2019.   She had no infusion  She denies any exacerbations.   MRI brain and cervical spine showed no new lesions in brain and no c-spine lesions (12/07/2018).     She feels gait is doing well.   No falls or stumble.   She can climb a ladder.   Strength is doing well and no  spasticity.    She denies numbness or tingling for the most part but the 4th and 5th finger on her left sometimes tingle and have pain.   Bladder is doing ok without frequency or hesitancy though she had one UTI last year.     Vision is doing well now and colors are symmetric (left ON was first symptom).   Fatigue is not a problem and she sleeps well.   Mood and cognition are doing well.     Update 08/25/2027:     She had her first 1/2 dose of ocrelizumab 02/18/2017 and the second half of the dose 2 weeks later. She is now due for her next dose (6 months) but her insurance company has not yet given the go-ahead.   She tolerated the infusions well. She has not had any exacerbations since starting ocrelizumab.    The neck pain that she was experiencing at the last follow-up is better. The trigger point injection helped. She continues to have leg cramps that are bothersome at night.  Ropinirole was not tolerated. Flexeril helped mildly.   Her gait is usually good but she sometimes loses balance.   The right leg is mildly weak at times but she denies foot drop, even  if she walks a long distance.     She notes a little tingling and numbness in her hands, but nothing troubling.     Vision is doing better but she continues to have mild duties visual acuity and altered color vision out of the left eye since her optic neuritis earlier this year.   There is no diplopia..   Bladder function is good.     She felt some fatigue over the summer, worse with heat.      She sleeps well most nights but sometimes the cramps keep her from falling asleep.   She denies any depression or anxiety.   She feels cognition is doing well though sometimes she is mildly forgetful.    ________________________-- From 04/03/2017:  She woke up fine Saturday morning but had more and more lower neck/upper back pain as the day went on.   Her neck seems stiff.    She denies any significant new numbness, weakness or change in gait.     No change  in bladder.    Pain is worse with laying down and has bene worse at bedtime.     She denies any new numbness, weakness, change in balance/gait or change in bladder.  MS:   She started ocrelizumab April 2018.   She tolerated it fairly well and just had a headache the second dose.      MS History:  On 11/30/2016, she had the onset of blurred vision and pain behind the left eye. She saw one ophthalmologist and told she had iritis and she was prescribed eye drops.    She saw another ophthalmologist 12/11/2016 and was [facility] for further management of optic neuritis and further diagnostic workup.    An MRI of the brain showed multiple lesions consistent with MS and she was admitted for IV Solu-Medrol.  ANA, Lyme, RPR and HIV were negative. TSH was mildly low.  After the steroids, she felt that the vision improved quite a bit though it is still not back to baseline.    She does not recall any other episode of neurologic symptoms lasting over a day.    I reviewed the MRI of the brain and orbits performed 12/11/2016. The MRI of the orbits shows enhancement of the left optic nerve. The nerve is also hyperintense on 10 T2 weighted images. The MRI of the brain shows increased signal on diffusion-weighted images with in the optic nerve. Multiple T2/FLAIR hyperintense foci, mostly in the periventricular white matter with some of the foci are radially oriented to the ventricles. One left juxtacortical foci is also seen.   Four or five of the foci enhanced, including 2 of periventricular foci and the one left juxtacortical focus.     She started ocrelizumab April 2018.  REVIEW OF SYSTEMS: Constitutional: No fevers, chills, sweats, or change in appetite Eyes: see above. Ear, nose and throat: No hearing loss, ear pain, nasal congestion, sore throat Cardiovascular: No chest pain.   She has had recent palpitations (recent normal Holter) Respiratory: No shortness of breath at rest or with  exertion.   No wheezes GastrointestinaI: No nausea, vomiting, diarrhea, abdominal pain, fecal incontinence Genitourinary: No dysuria, urinary retention or frequency.  No nocturia. Musculoskeletal: No neck pain, back pain Integumentary: No rash, pruritus, skin lesions Neurological: as above Psychiatric: No depression at this time.  No anxiety Endocrine: No palpitations, diaphoresis, change in appetite, change in weigh or increased thirst Hematologic/Lymphatic: No anemia, purpura, petechiae. Allergic/Immunologic:  No itchy/runny eyes, nasal congestion, recent allergic reactions, rashes  ALLERGIES: No Known Allergies  HOME MEDICATIONS:  Current Outpatient Medications:  .  ibuprofen (ADVIL,MOTRIN) 800 MG tablet, Take 1 tablet (800 mg total) by mouth every 8 (eight) hours as needed for cramping., Disp: 30 tablet, Rfl: 11 .  levocetirizine (XYZAL) 5 MG tablet, Take 1 tablet (5 mg total) by mouth every evening. (Patient taking differently: Take 5 mg by mouth daily as needed. ), Disp: 90 tablet, Rfl: 3 .  ocrelizumab 600 mg in sodium chloride 0.9 % 500 mL, Inject 600 mg into the vein every 6 (six) months., Disp: , Rfl:  .  scopolamine (TRANSDERM-SCOP) 1 MG/3DAYS, Place onto the skin as needed. , Disp: , Rfl:  No current facility-administered medications for this visit.   Facility-Administered Medications Ordered in Other Visits:  .  gadopentetate dimeglumine (MAGNEVIST) injection 14 mL, 14 mL, Intravenous, Once PRN, Sater, Pearletha Furl, MD  PAST MEDICAL HISTORY: Past Medical History:  Diagnosis Date  . Palpitations   . Vision abnormalities     PAST SURGICAL HISTORY: No past surgical history on file.  FAMILY HISTORY: Family History  Problem Relation Age of Onset  . Healthy Mother   . Healthy Father     SOCIAL HISTORY:  Social History   Socioeconomic History  . Marital status: Unknown    Spouse name: Not on file  . Number of children: Not on file  . Years of education: Not on  file  . Highest education level: Not on file  Occupational History  . Not on file  Social Needs  . Financial resource strain: Not on file  . Food insecurity    Worry: Not on file    Inability: Not on file  . Transportation needs    Medical: Not on file    Non-medical: Not on file  Tobacco Use  . Smoking status: Former Smoker    Quit date: 01/14/2012    Years since quitting: 7.4  . Smokeless tobacco: Never Used  Substance and Sexual Activity  . Alcohol use: Yes  . Drug use: No  . Sexual activity: Yes    Birth control/protection: Condom  Lifestyle  . Physical activity    Days per week: Not on file    Minutes per session: Not on file  . Stress: Not on file  Relationships  . Social Musician on phone: Not on file    Gets together: Not on file    Attends religious service: Not on file    Active member of club or organization: Not on file    Attends meetings of clubs or organizations: Not on file    Relationship status: Not on file  . Intimate partner violence    Fear of current or ex partner: Not on file    Emotionally abused: Not on file    Physically abused: Not on file    Forced sexual activity: Not on file  Other Topics Concern  . Not on file  Social History Narrative  . Not on file     PHYSICAL EXAM  Vitals:   06/15/19 1058  BP: (!) 99/59  Pulse: 92  Temp: 98.4 F (36.9 C)  Weight: 141 lb 8 oz (64.2 kg)  Height: 5\' 5"  (1.651 m)    Body mass index is 23.55 kg/m.   General: The patient is well-developed and well-nourished and in no acute distress   Neurologic Exam  Mental status: The patient is alert and oriented  x 3 at the time of the examination. The patient has apparent normal recent and remote memory, with an apparently normal attention span and concentration ability.   Speech is normal.  Cranial nerves: Extraocular movements are full. She has a 1+ APD.  She is reporting symmetric color vision.  Trapezius strength was normal.   The  tongue is midline, and the patient has symmetric elevation of the soft palate. No obvious hearing deficits are noted.  Motor:  Muscle bulk is normal.   Tone is normal. Strength is  5 / 5 in all 4 extremities.   Sensory: She has intact sensation to touch and vibration in the arms and legs.  Coordination: Cerebellar testing shows good finger-nose-finger and heel-to-shin bilaterally..  Gait and station: Station is normal.   Gait is normal. Tandem gait is normal. Romberg is negative.   Reflexes: Deep tendon reflexes are symmetric and normal in arms. DTRs are 3+ at the knees and 2 at the ankles.Marland Kitchen        DIAGNOSTIC DATA (LABS, IMAGING, TESTING) - I reviewed patient records, labs, notes, testing and imaging myself where available.  Lab Results  Component Value Date   WBC 5.7 11/15/2018   HGB 12.5 11/15/2018   HCT 38.4 11/15/2018   MCV 91 11/15/2018   PLT 270 11/15/2018      Component Value Date/Time   NA 141 11/15/2018 0850   K 4.6 11/15/2018 0850   CL 106 11/15/2018 0850   CO2 19 (L) 11/15/2018 0850   GLUCOSE 103 (H) 11/15/2018 0850   GLUCOSE 134 (H) 12/13/2016 0757   BUN 12 11/15/2018 0850   CREATININE 1.05 (H) 11/15/2018 0850   CALCIUM 9.7 11/15/2018 0850   PROT 6.3 11/15/2018 0850   ALBUMIN 4.5 11/15/2018 0850   AST 13 11/15/2018 0850   ALT 7 11/15/2018 0850   ALKPHOS 80 11/15/2018 0850   BILITOT 0.2 11/15/2018 0850   GFRNONAA 70 11/15/2018 0850   GFRAA 81 11/15/2018 0850    Lab Results  Component Value Date   [id number] 867 12/13/2016   Lab Results  Component Value Date   TSH 1.250 01/12/2017       ASSESSMENT AND PLAN   No diagnosis found.   1.  Continue Ocrevus.  Last MRI looked good.  IgG was slightly low and we will recheck along with the CBC and CMP 2.  Stay active and exercise as needed. 3.  rtc 6 months, sooner if problems      Richard A. Felecia Shelling, MD, PhD 6/72/0947, 09:62 AM Certified in Neurology, Clinical Neurophysiology, Sleep Medicine, Pain  Medicine and Neuroimaging  [facility] [address] [place], [place] [address] [phone]

## 2019-06-16 ENCOUNTER — Telehealth: Payer: Self-pay | Admitting: *Deleted

## 2019-06-16 LAB — COMPREHENSIVE METABOLIC PANEL
ALT: 10 IU/L (ref 0–32)
AST: 13 IU/L (ref 0–40)
Albumin/Globulin Ratio: 3 — ABNORMAL HIGH (ref 1.2–2.2)
Albumin: 4.8 g/dL (ref 3.8–4.8)
Alkaline Phosphatase: 56 IU/L (ref 39–117)
BUN/Creatinine Ratio: 11 (ref 9–23)
BUN: 10 mg/dL (ref 6–20)
Bilirubin Total: 0.4 mg/dL (ref 0.0–1.2)
CO2: 24 mmol/L (ref 20–29)
Calcium: 9.7 mg/dL (ref 8.7–10.2)
Chloride: 100 mmol/L (ref 96–106)
Creatinine, Ser: 0.89 mg/dL (ref 0.57–1.00)
GFR calc Af Amer: 98 mL/min/{1.73_m2} (ref >59)
GFR calc non Af Amer: 85 mL/min/{1.73_m2} (ref >59)
Globulin, Total: 1.6 g/dL (ref 1.5–4.5)
Glucose: 74 mg/dL (ref 65–99)
Potassium: 4.4 mmol/L (ref 3.5–5.2)
Sodium: 141 mmol/L (ref 134–144)
Total Protein: 6.4 g/dL (ref 6.0–8.5)

## 2019-06-16 LAB — CBC WITH DIFFERENTIAL/PLATELET
Basophils Absolute: 0 10*3/uL (ref 0.0–0.2)
Basos: 1 %
EOS (ABSOLUTE): 0 10*3/uL (ref 0.0–0.4)
Eos: 0 %
Hematocrit: 41.9 % (ref 34.0–46.6)
Hemoglobin: 13.5 g/dL (ref 11.1–15.9)
Immature Grans (Abs): 0 10*3/uL (ref 0.0–0.1)
Immature Granulocytes: 0 %
Lymphocytes Absolute: 2.1 10*3/uL (ref 0.7–3.1)
Lymphs: 30 %
MCH: 29.9 pg (ref 26.6–33.0)
MCHC: 32.2 g/dL (ref 31.5–35.7)
MCV: 93 fL (ref 79–97)
Monocytes Absolute: 0.5 10*3/uL (ref 0.1–0.9)
Monocytes: 8 %
Neutrophils Absolute: 4.3 10*3/uL (ref 1.4–7.0)
Neutrophils: 61 %
Platelets: 296 10*3/uL (ref 150–450)
RBC: 4.52 x10E6/uL (ref 3.77–5.28)
RDW: 12.8 % (ref 11.7–15.4)
WBC: 7 10*3/uL (ref 3.4–10.8)

## 2019-06-16 LAB — IGG, IGA, IGM
IgA/Immunoglobulin A, Serum: 57 mg/dL — ABNORMAL LOW (ref 87–352)
IgG (Immunoglobin G), Serum: 694 mg/dL (ref 586–1602)
IgM (Immunoglobulin M), Srm: 44 mg/dL (ref 26–217)

## 2019-06-16 NOTE — Telephone Encounter (Signed)
-----   Message from Britt Bottom, MD sent at 06/16/2019 10:13 AM EDT ----- Please let the patient know that the lab work is fine.

## 2019-07-11 ENCOUNTER — Encounter: Payer: Self-pay | Admitting: Physician Assistant

## 2019-07-11 ENCOUNTER — Telehealth: Payer: BLUE CROSS/BLUE SHIELD | Admitting: Physician Assistant

## 2019-07-11 DIAGNOSIS — R531 Weakness: Secondary | ICD-10-CM

## 2019-07-11 DIAGNOSIS — M549 Dorsalgia, unspecified: Secondary | ICD-10-CM

## 2019-07-11 NOTE — Progress Notes (Signed)
Based on what you shared with me, I feel your condition warrants further evaluation and I recommend that you be seen for a face to face office visit.  NOTE: If you entered your credit card information for this eVisit, you will not be charged. You may see a "hold" on your card for the $35 but that hold will drop off and you will not have a charge processed.   Please follow up for a face to face evaluation for further evaluation of your symptoms If you are having a true medical emergency please call 911.     For an urgent face to face visit, [place] has five urgent care centers for your convenience:   [email or web address] to reserve your spot online an avoid wait times  [place], [place], [place] [address] *Just off [address], across the road from [place] hours of operation: Monday-Friday, 12 PM to 6 PM  Closed Saturday & Sunday    . [facility]    [phone]                  Get Driving Directions  [address], [place] [address] . 10 am to 8 pm Monday-Friday . 12 pm to 8 pm Saturday-Sunday   . [facility] at [place]                  Get Driving Directions  [address], [place], [place] [address] . 8 am to 8 pm Monday-Friday . 9 am to 6 pm Saturday . 11 am to 6 pm Sunday   . [facility] at [place]                  Get Driving Directions   [address], [place] [address] . 8 am to 8 pm Monday-Friday . 8 am to 4 pm Saturday-Sunday    . [facility] at [place]                    Get Driving Directions  [phone]  [address], [place], [place] [address]  . Monday-Friday, 12 PM to 6 PM    Your e-visit answers were reviewed by a board certified advanced clinical practitioner to complete your personal care plan.  Thank  you for using e-Visits. I spent 5-10 minutes on review and completion of this note- Lacy Duverney [facility]

## 2019-07-12 ENCOUNTER — Other Ambulatory Visit: Payer: Self-pay

## 2019-07-12 ENCOUNTER — Encounter: Payer: Self-pay | Admitting: Neurology

## 2019-07-12 ENCOUNTER — Ambulatory Visit: Payer: BC Managed Care – PPO | Admitting: Neurology

## 2019-07-12 VITALS — BP 112/72 | HR 104 | Temp 96.9°F | Ht 65.0 in | Wt 139.0 lb

## 2019-07-12 DIAGNOSIS — M542 Cervicalgia: Secondary | ICD-10-CM | POA: Diagnosis not present

## 2019-07-12 DIAGNOSIS — E559 Vitamin D deficiency, unspecified: Secondary | ICD-10-CM

## 2019-07-12 DIAGNOSIS — R5383 Other fatigue: Secondary | ICD-10-CM

## 2019-07-12 DIAGNOSIS — G35 Multiple sclerosis: Secondary | ICD-10-CM

## 2019-07-12 DIAGNOSIS — Z79899 Other long term (current) drug therapy: Secondary | ICD-10-CM | POA: Diagnosis not present

## 2019-07-12 NOTE — Progress Notes (Addendum)
[facility]  PATIENT: Brianna Sanders DOB: 05/27/1986  REFERRING DOCTOR OR PCP:  Hope Pigeonebecca Everly SOURCE: patient, notes from ED, admission and PCP, MRI and lab reports, MRI images on PACS  _________________________________   HISTORICAL  CHIEF COMPLAINT:  Chief Complaint  Patient presents with   Follow-up    RM 12, alone. [place] pain, intermittent. Laying down, sx worsen.    Multiple Sclerosis    On Ocrevus    HISTORY OF PRESENT ILLNESS:   Brianna Sanders is a 33 y.o. woman with relapsing remitting MS.  Update 07/12/2019: For the past week, she has had left > right neck and upper back pain.  No definite trigger --- she feels she slept on it wrong and pain was present the one morning.   This is similar to the pain she had in 03/2017 that we treated with trigger point injection with benefit.  Her MS has been stable.    She is on Ocrevus.   She tolerates it well.  She does note more fatigue.   She sleeps well most night.    She recalls being vitamin D deficient several years ago and taking supplementation for a few months.  She does not drink milk or take vitamins.  Update 06/15/19: She is on Ocrevus and tolerates it well.   Her last infusion was 04/2019.    Her last exacerbation was at diagnosis 11/2016.    She has been on Ocrevus since 02/2017.  MRIs  12/07/18 showed no new MS lesions in the brain and a normal spinal cord.     She is walking and riding hr bike frequently.   She sometimes gets cramps when she exercises, less if she stretcher.   Balance is fine and she can go up and down stairs without holding on.   Strength and sensation are doing well.    She had a few UTIs and saw urology.   She was emptying well so no med's were needed.      She denies much fatigue.    She sleeps well.   She notes some depression but would like to avoid medications.   Cognition is doing ok though she is occasional forgetful.      Update  12/15/2018: She is on Ocrevus since 02/18/2017 and tolerates it well.  She had no infusion reactions.  Last infusion was in November 2019.   She had no infusion  She denies any exacerbations.   MRI brain and cervical spine showed no new lesions in brain and no c-spine lesions (12/07/2018).     She feels gait is doing well.   No falls or stumble.   She can climb a ladder.   Strength is doing well and no spasticity.    She denies numbness or tingling for the most part but the 4th and 5th finger on her left sometimes tingle and have pain.   Bladder is doing ok without frequency or hesitancy though she had one UTI last year.     Vision is doing well now and colors are symmetric (left ON was first symptom).   Fatigue is not a problem and she sleeps well.   Mood and cognition are doing well.     Update 08/25/2027:     She had her first 1/2 dose of ocrelizumab 02/18/2017 and the second half of the dose 2 weeks later. She is now due for her next dose (6 months) but her insurance company has not  yet given the go-ahead.   She tolerated the infusions well. She has not had any exacerbations since starting ocrelizumab.    The neck pain that she was experiencing at the last follow-up is better. The trigger point injection helped. She continues to have leg cramps that are bothersome at night.  Ropinirole was not tolerated. Flexeril helped mildly.   Her gait is usually good but she sometimes loses balance.   The right leg is mildly weak at times but she denies foot drop, even if she walks a long distance.     She notes a little tingling and numbness in her hands, but nothing troubling.     Vision is doing better but she continues to have mild duties visual acuity and altered color vision out of the left eye since her optic neuritis earlier this year.   There is no diplopia..   Bladder function is good.     She felt some fatigue over the summer, worse with heat.      She sleeps well most nights but sometimes the cramps keep  her from falling asleep.   She denies any depression or anxiety.   She feels cognition is doing well though sometimes she is mildly forgetful.    ________________________-- From 04/03/2017:  She woke up fine Saturday morning but had more and more lower neck/upper back pain as the day went on.   Her neck seems stiff.    She denies any significant new numbness, weakness or change in gait.     No change in bladder.    Pain is worse with laying down and has bene worse at bedtime.     She denies any new numbness, weakness, change in balance/gait or change in bladder.  MS:   She started ocrelizumab April 2018.   She tolerated it fairly well and just had a headache the second dose.      MS History:  On 11/30/2016, she had the onset of blurred vision and pain behind the left eye. She saw one ophthalmologist and told she had iritis and she was prescribed eye drops.    She saw another ophthalmologist 12/11/2016 and was referred to [facility] room for further management of optic neuritis and further diagnostic workup.    An MRI of the brain showed multiple lesions consistent with MS and she was admitted for IV Solu-Medrol.  ANA, Lyme, RPR and HIV were negative. TSH was mildly low.  After the steroids, she felt that the vision improved quite a bit though it is still not back to baseline.    She does not recall any other episode of neurologic symptoms lasting over a day.    I reviewed the MRI of the brain and orbits performed 12/11/2016. The MRI of the orbits shows enhancement of the left optic nerve. The nerve is also hyperintense on 10 T2 weighted images. The MRI of the brain shows increased signal on diffusion-weighted images with in the optic nerve. Multiple T2/FLAIR hyperintense foci, mostly in the periventricular white matter with some of the foci are radially oriented to the ventricles. One left juxtacortical foci is also seen.   Four or five of the foci enhanced, including 2 of periventricular foci  and the one left juxtacortical focus.     She started ocrelizumab April 2018.  REVIEW OF SYSTEMS: Constitutional: No fevers, chills, sweats, or change in appetite Eyes: see above. Ear, nose and throat: No hearing loss, ear pain, nasal congestion, sore throat Cardiovascular: No  chest pain.   She has had recent palpitations (recent normal Holter) Respiratory: No shortness of breath at rest or with exertion.   No wheezes GastrointestinaI: No nausea, vomiting, diarrhea, abdominal pain, fecal incontinence Genitourinary: No dysuria, urinary retention or frequency.  No nocturia. Musculoskeletal: No neck pain, back pain Integumentary: No rash, pruritus, skin lesions Neurological: as above Psychiatric: No depression at this time.  No anxiety Endocrine: No palpitations, diaphoresis, change in appetite, change in weigh or increased thirst Hematologic/Lymphatic: No anemia, purpura, petechiae. Allergic/Immunologic: No itchy/runny eyes, nasal congestion, recent allergic reactions, rashes  ALLERGIES: No Known Allergies  HOME MEDICATIONS:  Current Outpatient Medications:    ibuprofen (ADVIL,MOTRIN) 800 MG tablet, Take 1 tablet (800 mg total) by mouth every 8 (eight) hours as needed for cramping., Disp: 30 tablet, Rfl: 11   levocetirizine (XYZAL) 5 MG tablet, Take 1 tablet (5 mg total) by mouth every evening. (Patient taking differently: Take 5 mg by mouth daily as needed. ), Disp: 90 tablet, Rfl: 3   ocrelizumab 600 mg in sodium chloride 0.9 % 500 mL, Inject 600 mg into the vein every 6 (six) months., Disp: , Rfl:    scopolamine (TRANSDERM-SCOP) 1 MG/3DAYS, Place onto the skin as needed. , Disp: , Rfl:  No current facility-administered medications for this visit.   Facility-Administered Medications Ordered in Other Visits:    gadopentetate dimeglumine (MAGNEVIST) injection 14 mL, 14 mL, Intravenous, Once PRN, Levin Dagostino, Pearletha Furl, MD  PAST MEDICAL HISTORY: Past Medical History:  Diagnosis  Date   Palpitations    Vision abnormalities     PAST SURGICAL HISTORY: No past surgical history on file.  FAMILY HISTORY: Family History  Problem Relation Age of Onset   Healthy Mother    Healthy Father     SOCIAL HISTORY:  Social History   Socioeconomic History   Marital status: Unknown    Spouse name: Not on file   Number of children: Not on file   Years of education: Not on file   Highest education level: Not on file  Occupational History   Not on file  Social Needs   Financial resource strain: Not on file   Food insecurity    Worry: Not on file    Inability: Not on file   Transportation needs    Medical: Not on file    Non-medical: Not on file  Tobacco Use   Smoking status: Former Smoker    Quit date: 01/14/2012    Years since quitting: 7.4   Smokeless tobacco: Never Used  Substance and Sexual Activity   Alcohol use: Yes   Drug use: No   Sexual activity: Yes    Birth control/protection: Condom  Lifestyle   Physical activity    Days per week: Not on file    Minutes per session: Not on file   Stress: Not on file  Relationships   Social connections    Talks on phone: Not on file    Gets together: Not on file    Attends religious service: Not on file    Active member of club or organization: Not on file    Attends meetings of clubs or organizations: Not on file    Relationship status: Not on file   Intimate partner violence    Fear of current or ex partner: Not on file    Emotionally abused: Not on file    Physically abused: Not on file    Forced sexual activity: Not on file  Other Topics Concern  Not on file  Social History Narrative   Not on file     PHYSICAL EXAM  Vitals:   07/12/19 1257  BP: 112/72  Pulse: (!) 104  Temp: (!) 96.9 F (36.1 C)  Weight: 139 lb (63 kg)  Height: 5\' 5"  (1.651 m)    Body mass index is 23.13 kg/m.   General: The patient is well-developed and well-nourished and in no acute  distress.   She is tender over the lower cervical paraspinal muscles, trapezius and rhomboid muscles.  Range of motion was normal in the neck.   Neurologic Exam  Mental status: The patient is alert and oriented x 3 at the time of the examination. The patient has apparent normal recent and remote memory, with an apparently normal attention span and concentration ability.   Speech is normal.  Cranial nerves: Extraocular movements are full.  Trapezius strength was normal.   The tongue is midline, and the patient has symmetric elevation of the soft palate. No obvious hearing deficits are noted.  Motor:  Muscle bulk is normal.   Tone is normal. Strength is  5 / 5 in all 4 extremities.    Sensory: Not tested today  Coordination: Cerebellar testing shows good finger-nose-finger and heel-to-shin bilaterally..  Gait and station: Station is normal.   Gait and tandem gait are normal. Reflexes: Deep tendon reflexes are symmetric and normal in arms. DTRs are 3+ at the knees and 2 at the ankles.Marland Kitchen.        DIAGNOSTIC DATA (LABS, IMAGING, TESTING) - I reviewed patient records, labs, notes, testing and imaging myself where available.  Lab Results  Component Value Date   WBC 7.0 06/15/2019   HGB 13.5 06/15/2019   HCT 41.9 06/15/2019   MCV 93 06/15/2019   PLT 296 06/15/2019      Component Value Date/Time   NA 141 06/15/2019 1140   K 4.4 06/15/2019 1140   CL 100 06/15/2019 1140   CO2 24 06/15/2019 1140   GLUCOSE 74 06/15/2019 1140   GLUCOSE 134 (H) 12/13/2016 0757   BUN 10 06/15/2019 1140   CREATININE 0.89 06/15/2019 1140   CALCIUM 9.7 06/15/2019 1140   PROT 6.4 06/15/2019 1140   ALBUMIN 4.8 06/15/2019 1140   AST 13 06/15/2019 1140   ALT 10 06/15/2019 1140   ALKPHOS 56 06/15/2019 1140   BILITOT 0.4 06/15/2019 1140   GFRNONAA 85 06/15/2019 1140   GFRAA 98 06/15/2019 1140    Lab Results  Component Value Date   VITAMINB12 787 12/13/2016   Lab Results  Component Value Date   TSH 1.250  01/12/2017       ASSESSMENT AND PLAN   Neck pain  Other fatigue - Plan: Thyroid Panel With TSH  Vitamin D deficiency - Plan: VITAMIN D 25 Hydroxy (Vit-D Deficiency, Fractures)  High risk medication use - Plan: VITAMIN D 25 Hydroxy (Vit-D Deficiency, Fractures), Thyroid Panel With TSH  Multiple sclerosis (HCC)   1.  Continue Ocrevus for relapsing remitting MS.     2.  Trigger point injection of bilateral C6-C7 paraspinal muscles, rhomboid muscles and trapezius muscles with 80 mg Depo-Medrol and Marcaine using sterile technique.  She tolerated the injections well and there were no complications. 3.  Stay active and exercise as tolerated.   4.   Fatigue is doing worse.  She has been vitamin D deficient in the past and we will check that level as well as a thyroid panel.   5.   Rtc 6 months,  sooner if problems      Jacobo Moncrief A. Felecia Shelling, MD, PhD 9/62/8366, 2:94 PM Certified in Neurology, Clinical Neurophysiology, Sleep Medicine, Pain Medicine and Neuroimaging  [facility] [address] [place], [place] [address] [phone]

## 2019-07-13 ENCOUNTER — Telehealth: Payer: Self-pay | Admitting: *Deleted

## 2019-07-13 DIAGNOSIS — R7989 Other specified abnormal findings of blood chemistry: Secondary | ICD-10-CM

## 2019-07-13 LAB — THYROID PANEL WITH TSH
Free Thyroxine Index: 1.9 (ref 1.2–4.9)
T3 Uptake Ratio: 27 % (ref 24–39)
T4, Total: 6.9 ug/dL (ref 4.5–12.0)
TSH: 0.636 u[IU]/mL (ref 0.450–4.500)

## 2019-07-13 LAB — VITAMIN D 25 HYDROXY (VIT D DEFICIENCY, FRACTURES): Vit D, 25-Hydroxy: 24.6 ng/mL — ABNORMAL LOW (ref 30.0–100.0)

## 2019-07-13 MED ORDER — VITAMIN D (ERGOCALCIFEROL) 1.25 MG (50000 UNIT) PO CAPS: ORAL_CAPSULE | ORAL | 1 refills | Status: DC

## 2019-07-13 NOTE — Telephone Encounter (Signed)
-----   Message from Britt Bottom, MD sent at 07/13/2019  9:06 AM EDT ----- Thyroid is normal Vit D is low    Lets do 50000 weekly x 26 weeks    #13 #1   Then 5000U OTC daily Thanks

## 2019-08-04 DIAGNOSIS — Z20828 Contact with and (suspected) exposure to other viral communicable diseases: Secondary | ICD-10-CM | POA: Diagnosis not present

## 2019-08-08 ENCOUNTER — Other Ambulatory Visit: Payer: Self-pay | Admitting: Neurology

## 2019-08-08 MED ORDER — BACLOFEN 10 MG PO TABS: ORAL_TABLET | ORAL | 5 refills | Status: AC

## 2019-08-16 DIAGNOSIS — R8290 Unspecified abnormal findings in urine: Secondary | ICD-10-CM | POA: Diagnosis not present

## 2019-08-16 DIAGNOSIS — Z118 Encounter for screening for other infectious and parasitic diseases: Secondary | ICD-10-CM | POA: Diagnosis not present

## 2019-08-16 DIAGNOSIS — Z1159 Encounter for screening for other viral diseases: Secondary | ICD-10-CM | POA: Diagnosis not present

## 2019-08-16 DIAGNOSIS — Z20828 Contact with and (suspected) exposure to other viral communicable diseases: Secondary | ICD-10-CM | POA: Diagnosis not present

## 2019-08-16 DIAGNOSIS — Z114 Encounter for screening for human immunodeficiency virus [HIV]: Secondary | ICD-10-CM | POA: Diagnosis not present

## 2019-08-16 DIAGNOSIS — N390 Urinary tract infection, site not specified: Secondary | ICD-10-CM | POA: Diagnosis not present

## 2019-08-16 DIAGNOSIS — Z113 Encounter for screening for infections with a predominantly sexual mode of transmission: Secondary | ICD-10-CM | POA: Diagnosis not present

## 2019-08-16 DIAGNOSIS — B373 Candidiasis of vulva and vagina: Secondary | ICD-10-CM | POA: Diagnosis not present

## 2019-08-29 ENCOUNTER — Other Ambulatory Visit: Payer: Self-pay

## 2019-08-29 DIAGNOSIS — Z20822 Contact with and (suspected) exposure to covid-19: Secondary | ICD-10-CM

## 2019-08-29 DIAGNOSIS — N891 Moderate vaginal dysplasia: Secondary | ICD-10-CM | POA: Diagnosis not present

## 2019-08-29 DIAGNOSIS — N898 Other specified noninflammatory disorders of vagina: Secondary | ICD-10-CM | POA: Diagnosis not present

## 2019-08-29 DIAGNOSIS — Z20828 Contact with and (suspected) exposure to other viral communicable diseases: Secondary | ICD-10-CM | POA: Diagnosis not present

## 2019-08-29 DIAGNOSIS — R8290 Unspecified abnormal findings in urine: Secondary | ICD-10-CM | POA: Diagnosis not present

## 2019-08-29 DIAGNOSIS — L292 Pruritus vulvae: Secondary | ICD-10-CM | POA: Diagnosis not present

## 2019-08-30 LAB — NOVEL CORONAVIRUS, NAA: SARS-CoV-2, NAA: NOT DETECTED

## 2019-09-22 ENCOUNTER — Other Ambulatory Visit: Payer: Self-pay

## 2019-09-22 DIAGNOSIS — Z20822 Contact with and (suspected) exposure to covid-19: Secondary | ICD-10-CM

## 2019-09-23 LAB — NOVEL CORONAVIRUS, NAA: SARS-CoV-2, NAA: NOT DETECTED

## 2019-10-12 ENCOUNTER — Other Ambulatory Visit: Payer: Self-pay

## 2019-10-12 DIAGNOSIS — Z20822 Contact with and (suspected) exposure to covid-19: Secondary | ICD-10-CM

## 2019-10-13 LAB — NOVEL CORONAVIRUS, NAA: SARS-CoV-2, NAA: NOT DETECTED

## 2019-10-25 DIAGNOSIS — Z118 Encounter for screening for other infectious and parasitic diseases: Secondary | ICD-10-CM | POA: Diagnosis not present

## 2019-10-25 DIAGNOSIS — R8290 Unspecified abnormal findings in urine: Secondary | ICD-10-CM | POA: Diagnosis not present

## 2019-10-25 DIAGNOSIS — R8289 Other abnormal findings on cytological and histological examination of urine: Secondary | ICD-10-CM | POA: Diagnosis not present

## 2019-10-25 DIAGNOSIS — Z1159 Encounter for screening for other viral diseases: Secondary | ICD-10-CM | POA: Diagnosis not present

## 2019-10-25 DIAGNOSIS — N898 Other specified noninflammatory disorders of vagina: Secondary | ICD-10-CM | POA: Diagnosis not present

## 2019-10-25 DIAGNOSIS — Z113 Encounter for screening for infections with a predominantly sexual mode of transmission: Secondary | ICD-10-CM | POA: Diagnosis not present

## 2019-10-25 DIAGNOSIS — Z01419 Encounter for gynecological examination (general) (routine) without abnormal findings: Secondary | ICD-10-CM | POA: Diagnosis not present

## 2019-10-25 DIAGNOSIS — Z114 Encounter for screening for human immunodeficiency virus [HIV]: Secondary | ICD-10-CM | POA: Diagnosis not present

## 2019-10-25 DIAGNOSIS — N760 Acute vaginitis: Secondary | ICD-10-CM | POA: Diagnosis not present

## 2019-10-27 ENCOUNTER — Other Ambulatory Visit: Payer: Self-pay | Admitting: *Deleted

## 2019-10-27 MED ORDER — ETODOLAC 400 MG PO TABS: 400.0000 mg | ORAL_TABLET | Freq: Two times a day (BID) | ORAL | 5 refills | Status: DC

## 2019-10-31 ENCOUNTER — Other Ambulatory Visit: Payer: Self-pay

## 2019-10-31 DIAGNOSIS — Z20822 Contact with and (suspected) exposure to covid-19: Secondary | ICD-10-CM

## 2019-11-02 ENCOUNTER — Other Ambulatory Visit: Payer: BLUE CROSS/BLUE SHIELD

## 2019-11-03 LAB — NOVEL CORONAVIRUS, NAA: SARS-CoV-2, NAA: NOT DETECTED

## 2019-11-07 ENCOUNTER — Ambulatory Visit: Payer: BC Managed Care – PPO | Attending: Internal Medicine

## 2019-11-07 ENCOUNTER — Other Ambulatory Visit: Payer: BLUE CROSS/BLUE SHIELD

## 2019-11-07 DIAGNOSIS — Z20828 Contact with and (suspected) exposure to other viral communicable diseases: Secondary | ICD-10-CM | POA: Diagnosis not present

## 2019-11-07 DIAGNOSIS — Z20822 Contact with and (suspected) exposure to covid-19: Secondary | ICD-10-CM

## 2019-11-08 DIAGNOSIS — G35 Multiple sclerosis: Secondary | ICD-10-CM | POA: Diagnosis not present

## 2019-11-08 LAB — NOVEL CORONAVIRUS, NAA: SARS-CoV-2, NAA: NOT DETECTED

## 2019-12-20 ENCOUNTER — Other Ambulatory Visit: Payer: Self-pay

## 2019-12-20 ENCOUNTER — Ambulatory Visit: Payer: BC Managed Care – PPO | Admitting: Neurology

## 2019-12-20 ENCOUNTER — Encounter: Payer: Self-pay | Admitting: Neurology

## 2019-12-20 ENCOUNTER — Telehealth: Payer: Self-pay | Admitting: *Deleted

## 2019-12-20 VITALS — BP 121/83 | HR 94 | Temp 97.6°F | Ht 65.0 in | Wt 130.5 lb

## 2019-12-20 DIAGNOSIS — Z79899 Other long term (current) drug therapy: Secondary | ICD-10-CM | POA: Diagnosis not present

## 2019-12-20 DIAGNOSIS — G35 Multiple sclerosis: Secondary | ICD-10-CM | POA: Diagnosis not present

## 2019-12-20 DIAGNOSIS — M542 Cervicalgia: Secondary | ICD-10-CM

## 2019-12-20 DIAGNOSIS — F329 Major depressive disorder, single episode, unspecified: Secondary | ICD-10-CM | POA: Insufficient documentation

## 2019-12-20 DIAGNOSIS — E559 Vitamin D deficiency, unspecified: Secondary | ICD-10-CM | POA: Diagnosis not present

## 2019-12-20 DIAGNOSIS — N390 Urinary tract infection, site not specified: Secondary | ICD-10-CM

## 2019-12-20 DIAGNOSIS — F32A Depression, unspecified: Secondary | ICD-10-CM

## 2019-12-20 MED ORDER — MELOXICAM 15 MG PO TABS: 15.0000 mg | ORAL_TABLET | Freq: Every day | ORAL | 5 refills | Status: AC

## 2019-12-20 NOTE — Telephone Encounter (Signed)
Faxed updated pt consent form for Ocrevus to [employer] at [phone]. Received fax confirmation

## 2019-12-20 NOTE — Progress Notes (Signed)
[facility]  PATIENT: Brianna Sanders DOB: [birth year]  REFERRING DOCTOR OR PCP:  Doug Sou SOURCE: patient, notes from ED, admission and PCP, MRI and lab reports, MRI images on PACS  _________________________________   HISTORICAL  CHIEF COMPLAINT:  Chief Complaint  Patient presents with  . Follow-up    RM 13, alone. Last seen 07/12/2019. She did have a change of insurance. She does not have with her today. She will upload copy to mychart and let me know once she does this so I can provide copy to [facility].   . Multiple Sclerosis    On Ocrevus. Last infusion 11/08/19. Next due around 04/2020.     HISTORY OF PRESENT ILLNESS:   Brianna Sanders is a 34 y.o. woman with relapsing remitting MS.  Update 12/20/2019: She is on Ocrevus and has tolerated it well.   Her next infusion will be in June 2021.    She has no exacerbations while on it.      She is walking well.  No falls and goes up/down stairs without the bannister.   She denies much numbness or tingling.    She feels she empties her bladder well.   She gets some bacterial UTIs.   She reports a bladder u/s showed that she emptied well.    She is having them once a month.    She was just on macrodantin but has trouble swallowing pills so does not always take  She notes mild depression and feels she is more moody.   She would like to see a counselor rather than take anothr medication.  We discussed the vaccination.  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  Update 07/12/2019: For the past week, she has had left > right neck and upper back pain.  No definite trigger --- she feels she slept on it wrong and pain was present the one morning.   This is similar to the pain she had in 03/2017 that we treated with trigger point injection with benefit.  Her MS has been stable.    She is on Ocrevus.   She tolerates it well.  She does note more fatigue.   She sleeps well most night.    She recalls  being vitamin D deficient several years ago and taking supplementation for a few months.  She does not drink milk or take vitamins.  Update 06/15/19: She is on Ocrevus and tolerates it well.   Her last infusion was 04/2019.    Her last exacerbation was at diagnosis 11/2016.    She has been on Ocrevus since 02/2017.  MRIs  12/07/18 showed no new MS lesions in the brain and a normal spinal cord.     She is walking and riding hr bike frequently.   She sometimes gets cramps when she exercises, less if she stretcher.   Balance is fine and she can go up and down stairs without holding on.   Strength and sensation are doing well.    She had a few UTIs and saw urology.   She was emptying well so no med's were needed.      She denies much fatigue.    She sleeps well.   She notes some depression but would like to avoid medications.   Cognition is doing ok though she is occasional forgetful.      Update 12/15/2018: She is on Ocrevus since 02/18/2017 and tolerates it well.  She had no infusion reactions.  Last infusion was  in November 2019.   She had no infusion  She denies any exacerbations.   MRI brain and cervical spine showed no new lesions in brain and no c-spine lesions (12/07/2018).     She feels gait is doing well.   No falls or stumble.   She can climb a ladder.   Strength is doing well and no spasticity.    She denies numbness or tingling for the most part but the 4th and 5th finger on her left sometimes tingle and have pain.   Bladder is doing ok without frequency or hesitancy though she had one UTI last year.     Vision is doing well now and colors are symmetric (left ON was first symptom).   Fatigue is not a problem and she sleeps well.   Mood and cognition are doing well.     Update 08/25/2027:     She had her first 1/2 dose of ocrelizumab 02/18/2017 and the second half of the dose 2 weeks later. She is now due for her next dose (6 months) but her insurance company has not yet given the go-ahead.   She  tolerated the infusions well. She has not had any exacerbations since starting ocrelizumab.    The neck pain that she was experiencing at the last follow-up is better. The trigger point injection helped. She continues to have leg cramps that are bothersome at night.  Ropinirole was not tolerated. Flexeril helped mildly.   Her gait is usually good but she sometimes loses balance.   The right leg is mildly weak at times but she denies foot drop, even if she walks a long distance.     She notes a little tingling and numbness in her hands, but nothing troubling.     Vision is doing better but she continues to have mild duties visual acuity and altered color vision out of the left eye since her optic neuritis earlier this year.   There is no diplopia..   Bladder function is good.     She felt some fatigue over the summer, worse with heat.      She sleeps well most nights but sometimes the cramps keep her from falling asleep.   She denies any depression or anxiety.   She feels cognition is doing well though sometimes she is mildly forgetful.    From 04/03/2017:  She woke up fine Saturday morning but had more and more lower neck/upper back pain as the day went on.   Her neck seems stiff.    She denies any significant new numbness, weakness or change in gait.     No change in bladder.    Pain is worse with laying down and has bene worse at bedtime.     She denies any new numbness, weakness, change in balance/gait or change in bladder.  MS:   She started ocrelizumab April 2018.   She tolerated it fairly well and just had a headache the second dose.      MS History:  On 11/30/2016, she had the onset of blurred vision and pain behind the left eye. She saw one ophthalmologist and told she had iritis and she was prescribed eye drops.    She saw another ophthalmologist 12/11/2016 and was referred to the [facility] emergency room for further management of optic neuritis and further diagnostic workup.    An MRI of the  brain showed multiple lesions consistent with MS and she was admitted for IV Solu-Medrol.  ANA,  Lyme, RPR and HIV were negative. TSH was mildly low.  After the steroids, she felt that the vision improved quite a bit though it is still not back to baseline.    She does not recall any other episode of neurologic symptoms lasting over a day.    I reviewed the MRI of the brain and orbits performed 12/11/2016. The MRI of the orbits shows enhancement of the left optic nerve. The nerve is also hyperintense on 10 T2 weighted images. The MRI of the brain shows increased signal on diffusion-weighted images with in the optic nerve. Multiple T2/FLAIR hyperintense foci, mostly in the periventricular white matter with some of the foci are radially oriented to the ventricles. One left juxtacortical foci is also seen.   Four or five of the foci enhanced, including 2 of periventricular foci and the one left juxtacortical focus.     She started ocrelizumab April 2018.  REVIEW OF SYSTEMS: Constitutional: No fevers, chills, sweats, or change in appetite Eyes: see above. Ear, nose and throat: No hearing loss, ear pain, nasal congestion, sore throat Cardiovascular: No chest pain.   She has had recent palpitations (recent normal Holter) Respiratory: No shortness of breath at rest or with exertion.   No wheezes GastrointestinaI: No nausea, vomiting, diarrhea, abdominal pain, fecal incontinence Genitourinary: No dysuria, urinary retention or frequency.  No nocturia. Musculoskeletal: No neck pain, back pain Integumentary: No rash, pruritus, skin lesions Neurological: as above Psychiatric: No depression at this time.  No anxiety Endocrine: No palpitations, diaphoresis, change in appetite, change in weigh or increased thirst Hematologic/Lymphatic: No anemia, purpura, petechiae. Allergic/Immunologic: No itchy/runny eyes, nasal congestion, recent allergic reactions, rashes  ALLERGIES: No Known Allergies  HOME  MEDICATIONS:  Current Outpatient Medications:  .  ibuprofen (ADVIL,MOTRIN) 800 MG tablet, Take 1 tablet (800 mg total) by mouth every 8 (eight) hours as needed for cramping., Disp: 30 tablet, Rfl: 11 .  levocetirizine (XYZAL) 5 MG tablet, Take 1 tablet (5 mg total) by mouth every evening. (Patient taking differently: Take 5 mg by mouth daily as needed. ), Disp: 90 tablet, Rfl: 3 .  ocrelizumab 600 mg in sodium chloride 0.9 % 500 mL, Inject 600 mg into the vein every 6 (six) months., Disp: , Rfl:  .  scopolamine (TRANSDERM-SCOP) 1 MG/3DAYS, Place onto the skin as needed. , Disp: , Rfl:  .  Vitamin D, Ergocalciferol, (DRISDOL) 1.25 MG (50000 UT) CAPS capsule, Take 1 capsule by mouth weekly for 26 weeks. Then switch to 5000U VIT D OTC daily, Disp: 13 capsule, Rfl: 1 .  baclofen (LIORESAL) 10 MG tablet, 1/2 to 1 pill up to three times a day (Patient not taking: Reported on 12/20/2019), Disp: 90 each, Rfl: 5 .  meloxicam (MOBIC) 15 MG tablet, Take 1 tablet (15 mg total) by mouth daily., Disp: 30 tablet, Rfl: 5 No current facility-administered medications for this visit.  Facility-Administered Medications Ordered in Other Visits:  .  gadopentetate dimeglumine (MAGNEVIST) injection 14 mL, 14 mL, Intravenous, Once PRN, Melany Wiesman, Pearletha Furl, MD  PAST MEDICAL HISTORY: Past Medical History:  Diagnosis Date  . Palpitations   . Vision abnormalities     PAST SURGICAL HISTORY: No past surgical history on file.  FAMILY HISTORY: Family History  Problem Relation Age of Onset  . Healthy Mother   . Healthy Father     SOCIAL HISTORY:  Social History   Socioeconomic History  . Marital status: Unknown    Spouse name: Not on file  . Number of  children: Not on file  . Years of education: Not on file  . Highest education level: Not on file  Occupational History  . Not on file  Tobacco Use  . Smoking status: Former Smoker    Quit date: 01/14/2012    Years since quitting: 7.9  . Smokeless tobacco: Never  Used  Substance and Sexual Activity  . Alcohol use: Yes  . Drug use: No  . Sexual activity: Yes    Birth control/protection: Condom  Other Topics Concern  . Not on file  Social History Narrative  . Not on file   Social Determinants of Health   Financial Resource Strain:   . Difficulty of Paying Living Expenses: Not on file  Food Insecurity:   . Worried About Programme researcher, broadcasting/film/video in the Last Year: Not on file  . Ran Out of Food in the Last Year: Not on file  Transportation Needs:   . Lack of Transportation (Medical): Not on file  . Lack of Transportation (Non-Medical): Not on file  Physical Activity:   . Days of Exercise per Week: Not on file  . Minutes of Exercise per Session: Not on file  Stress:   . Feeling of Stress : Not on file  Social Connections:   . Frequency of Communication with Friends and Family: Not on file  . Frequency of Social Gatherings with Friends and Family: Not on file  . Attends Religious Services: Not on file  . Active Member of Clubs or Organizations: Not on file  . Attends Banker Meetings: Not on file  . Marital Status: Not on file  Intimate Partner Violence:   . Fear of Current or Ex-Partner: Not on file  . Emotionally Abused: Not on file  . Physically Abused: Not on file  . Sexually Abused: Not on file     PHYSICAL EXAM  Vitals:   12/20/19 1142  BP: 121/83  Pulse: 94  Temp: 97.6 F (36.4 C)  Weight: 130 lb 8 oz (59.2 kg)  Height: 5\' 5"  (1.651 m)    Body mass index is 21.72 kg/m.   General: The patient is well-developed and well-nourished and in no acute distress.   She is tender over the lower cervical paraspinal muscles, trapezius and rhomboid muscles.  Range of motion was normal in the neck.   Neurologic Exam  Mental status: The patient is alert and oriented x 3 at the time of the examination. The patient has apparent normal recent and remote memory, with an apparently normal attention span and concentration  ability.   Speech is normal.  Cranial nerves: Extraocular movements are full.  Trapezius strength was normal.   The tongue is midline, and the patient has symmetric elevation of the soft palate. No obvious hearing deficits are noted.  Motor:  Muscle bulk is normal.   Muscle tone is normal.  Strength is 5/5.   Sensory: Not tested today  Coordination: Cerebellar testing shows good finger-nose-finger and heel-to-shin bilaterally..  Gait and station: Station is normal.   Gait and tandem gait are normal.  Reflexes: Deep tendon reflexes are symmetric and normal in arms. DTRs are 3+ at the knees and 2 at the ankles.        DIAGNOSTIC DATA (LABS, IMAGING, TESTING) - I reviewed patient records, labs, notes, testing and imaging myself where available.  Lab Results  Component Value Date   WBC 7.0 06/15/2019   HGB 13.5 06/15/2019   HCT 41.9 06/15/2019   MCV 93 06/15/2019  PLT 296 06/15/2019      Component Value Date/Time   NA 141 06/15/2019 1140   K 4.4 06/15/2019 1140   CL 100 06/15/2019 1140   CO2 24 06/15/2019 1140   GLUCOSE 74 06/15/2019 1140   GLUCOSE 134 (H) 12/13/2016 0757   BUN 10 06/15/2019 1140   CREATININE 0.89 06/15/2019 1140   CALCIUM 9.7 06/15/2019 1140   PROT 6.4 06/15/2019 1140   ALBUMIN 4.8 06/15/2019 1140   AST 13 06/15/2019 1140   ALT 10 06/15/2019 1140   ALKPHOS 56 06/15/2019 1140   BILITOT 0.4 06/15/2019 1140   GFRNONAA 85 06/15/2019 1140   GFRAA 98 06/15/2019 1140    Lab Results  Component Value Date   VITAMINB12 787 12/13/2016   Lab Results  Component Value Date   TSH 0.636 07/12/2019       ASSESSMENT AND PLAN   Multiple sclerosis (HCC) - Plan: Ambulatory referral to Behavioral Health  High risk medication use  Neck pain  Vitamin D deficiency  Depression, unspecified depression type - Plan: Ambulatory referral to Behavioral Health  Frequent UTI   1.  Continue Ocrevus for relapsing remitting MS.    Labs were fine last visit.     Her last MRI a year ago was stable.  2.  Change etodolac to meloxicam 3.  Stay active and exercise as tolerated.   4.   Take 5000 U Vit D.   5.   Rtc 6 months, sooner if problems      Lan Entsminger A. Epimenio Foot, MD, PhD 12/20/2019, 5:50 PM Certified in Neurology, Clinical Neurophysiology, Sleep Medicine, Pain Medicine and Neuroimaging  [facility] [address] [place], [place] [address] [phone]

## 2019-12-29 NOTE — Telephone Encounter (Signed)
Faxed updated prescriber service form to [employer] at [phone]. Received fax confirmation.

## 2020-01-10 DIAGNOSIS — N907 Vulvar cyst: Secondary | ICD-10-CM | POA: Diagnosis not present

## 2020-01-17 ENCOUNTER — Other Ambulatory Visit: Payer: Self-pay | Admitting: Neurology

## 2020-01-17 DIAGNOSIS — R7989 Other specified abnormal findings of blood chemistry: Secondary | ICD-10-CM

## 2020-01-20 ENCOUNTER — Ambulatory Visit: Payer: Self-pay | Attending: Internal Medicine

## 2020-01-20 DIAGNOSIS — Z23 Encounter for immunization: Secondary | ICD-10-CM | POA: Insufficient documentation

## 2020-01-20 NOTE — Progress Notes (Signed)
   Covid-19 Vaccination Clinic  Name:  Safiya Girdler    MRN: [id number] DOB: [birth year]  01/20/2020  Ms. Huante was observed post Covid-19 immunization for 15 minutes without incident. She was provided with Vaccine Information Sheet and instruction to access the V-Safe system.   Ms. Pitney was instructed to call 911 with any severe reactions post vaccine: Marland Kitchen Difficulty breathing  . Swelling of face and throat  . A fast heartbeat  . A bad rash all over body  . Dizziness and weakness   Immunizations Administered    Name Date Dose VIS Date Route   Pfizer COVID-19 Vaccine 01/20/2020 10:04 AM 0.3 mL 10/28/2019 Intramuscular   Manufacturer: [employer], [employer]   Lot: [id number]   NDC: [address]-2

## 2020-02-13 ENCOUNTER — Ambulatory Visit: Payer: BC Managed Care – PPO | Admitting: Psychology

## 2020-02-14 ENCOUNTER — Ambulatory Visit (INDEPENDENT_AMBULATORY_CARE_PROVIDER_SITE_OTHER): Payer: BC Managed Care – PPO | Admitting: Psychology

## 2020-02-14 DIAGNOSIS — F0631 Mood disorder due to known physiological condition with depressive features: Secondary | ICD-10-CM | POA: Diagnosis not present

## 2020-02-15 ENCOUNTER — Ambulatory Visit: Payer: BC Managed Care – PPO | Attending: Internal Medicine

## 2020-02-15 DIAGNOSIS — Z23 Encounter for immunization: Secondary | ICD-10-CM

## 2020-02-15 NOTE — Progress Notes (Signed)
   Covid-19 Vaccination Clinic  Name:  Brianna Sanders    MRN: [id number] DOB: Dec 19, 1985  02/15/2020  Brianna Sanders was observed post Covid-19 immunization for 15 minutes without incident. She was provided with Vaccine Information Sheet and instruction to access the V-Safe system.   Brianna Sanders was instructed to call 911 with any severe reactions post vaccine: Marland Kitchen Difficulty breathing  . Swelling of face and throat  . A fast heartbeat  . A bad rash all over body  . Dizziness and weakness   Immunizations Administered    Name Date Dose VIS Date Route   Pfizer COVID-19 Vaccine 02/15/2020 12:18 PM 0.3 mL 10/28/2019 Intramuscular   Manufacturer: [employer], [employer]   Lot: [id number]   NDC: [address]-5

## 2020-02-20 ENCOUNTER — Ambulatory Visit (INDEPENDENT_AMBULATORY_CARE_PROVIDER_SITE_OTHER): Payer: BC Managed Care – PPO | Admitting: Psychology

## 2020-02-20 DIAGNOSIS — F0631 Mood disorder due to known physiological condition with depressive features: Secondary | ICD-10-CM

## 2020-03-12 ENCOUNTER — Ambulatory Visit (INDEPENDENT_AMBULATORY_CARE_PROVIDER_SITE_OTHER): Payer: BC Managed Care – PPO | Admitting: Psychology

## 2020-03-12 DIAGNOSIS — F0631 Mood disorder due to known physiological condition with depressive features: Secondary | ICD-10-CM

## 2020-04-04 ENCOUNTER — Ambulatory Visit: Payer: BC Managed Care – PPO | Admitting: Psychology

## 2020-04-10 ENCOUNTER — Ambulatory Visit: Payer: BC Managed Care – PPO | Admitting: Psychology

## 2020-04-17 ENCOUNTER — Ambulatory Visit: Payer: Self-pay | Admitting: Psychology

## 2020-04-23 ENCOUNTER — Encounter: Payer: Self-pay | Admitting: *Deleted

## 2020-04-25 ENCOUNTER — Encounter: Payer: Self-pay | Admitting: *Deleted

## 2020-04-25 NOTE — Telephone Encounter (Addendum)
Called pt at [phone] and LVM for her to call office. [facility] has not heard from her yet and I have not either. I also sent a letter to her.

## 2020-05-01 ENCOUNTER — Ambulatory Visit (INDEPENDENT_AMBULATORY_CARE_PROVIDER_SITE_OTHER): Payer: Self-pay | Admitting: Psychology

## 2020-05-01 DIAGNOSIS — F0631 Mood disorder due to known physiological condition with depressive features: Secondary | ICD-10-CM

## 2020-05-15 ENCOUNTER — Ambulatory Visit (INDEPENDENT_AMBULATORY_CARE_PROVIDER_SITE_OTHER): Payer: BC Managed Care – PPO | Admitting: Psychology

## 2020-05-15 DIAGNOSIS — F0631 Mood disorder due to known physiological condition with depressive features: Secondary | ICD-10-CM | POA: Diagnosis not present

## 2020-05-29 ENCOUNTER — Ambulatory Visit: Payer: Self-pay | Admitting: Psychology

## 2020-05-30 ENCOUNTER — Ambulatory Visit (INDEPENDENT_AMBULATORY_CARE_PROVIDER_SITE_OTHER): Payer: BC Managed Care – PPO | Admitting: Psychology

## 2020-05-30 DIAGNOSIS — F0630 Mood disorder due to known physiological condition, unspecified: Secondary | ICD-10-CM

## 2020-06-12 ENCOUNTER — Ambulatory Visit (INDEPENDENT_AMBULATORY_CARE_PROVIDER_SITE_OTHER): Payer: BC Managed Care – PPO | Admitting: Psychology

## 2020-06-12 DIAGNOSIS — F0631 Mood disorder due to known physiological condition with depressive features: Secondary | ICD-10-CM | POA: Diagnosis not present

## 2020-06-13 ENCOUNTER — Telehealth: Payer: Self-pay | Admitting: *Deleted

## 2020-06-13 NOTE — Telephone Encounter (Signed)
LVM to see if pt could come at 9am instead of 1130am on 06/18/20. Dr. Epimenio Foot has a meeting at 1130am

## 2020-06-13 NOTE — Telephone Encounter (Signed)
Called pt. Moved appt to 11am (Dr. Epimenio Foot had cx). Cx appt for 1130am. Pt aware to check in now at 1030am instead on 06/18/2020

## 2020-06-18 ENCOUNTER — Ambulatory Visit: Payer: BC Managed Care – PPO | Admitting: Neurology

## 2020-06-18 ENCOUNTER — Encounter: Payer: Self-pay | Admitting: Neurology

## 2020-06-18 VITALS — BP 126/86 | HR 92 | Ht 65.0 in | Wt 133.0 lb

## 2020-06-18 DIAGNOSIS — Z79899 Other long term (current) drug therapy: Secondary | ICD-10-CM

## 2020-06-18 DIAGNOSIS — Z8669 Personal history of other diseases of the nervous system and sense organs: Secondary | ICD-10-CM

## 2020-06-18 DIAGNOSIS — G35 Multiple sclerosis: Secondary | ICD-10-CM

## 2020-06-18 DIAGNOSIS — E559 Vitamin D deficiency, unspecified: Secondary | ICD-10-CM

## 2020-06-18 DIAGNOSIS — R5383 Other fatigue: Secondary | ICD-10-CM

## 2020-06-18 NOTE — Progress Notes (Signed)
[facility]  PATIENT: Brianna Sanders DOB: 06/21/1986  REFERRING DOCTOR OR PCP:  Hope Pigeon SOURCE: patient, notes from ED, admission and PCP, MRI and lab reports, MRI images on PACS  _________________________________   HISTORICAL  CHIEF COMPLAINT:  Chief Complaint  Patient presents with  . Follow-up    RM 12, alone. Last seen 12/20/2019.   . Multiple Sclerosis    On Ocrevus. Last infusion: 05/29/2020. Next infusion: 12/12/2019. Receives at [facility] with intrafusion.     HISTORY OF PRESENT ILLNESS:  Brianna Sanders is a 34 y.o. woman with relapsing remitting MS.  Update 06/18/2020: She is on Ocrevus and has tolerated it well.   Her last infusion was July 2021.    She has no exacerbations while on it.      Gait is doing well and she is active.   She can walk miles without stopping.  No falls and goes up/down stairs without the bannister.    She feels strength and sensation are symmetric.     She denies dysesthesias but gets some cramps, less than before.    Bladder function is ok.  She takes nitrofurantoin if she suspects she might have a UTI.   For a while she was taking daily but she has difficulty swallowing pills and change.  Vision is doing well  She has some fatigue and rests.   She still accomplishes all her tasks.   She sleeps well most nights, getting 6-7 hours many nights.   Mood and cognition are doing well.    She talks to [place] every couple weeks at [employer].   She may be moving to [place] or [place] soon.      She had the Covid vaccination in March.  We discussed efficacy may be reduced for patients on Ocrevus.      MS History:  On 11/30/2016, she had the onset of blurred vision and pain behind the left eye. She saw one ophthalmologist and told she had iritis and she was prescribed eye drops.    She saw another ophthalmologist 12/11/2016 and was referred to the [facility] emergency room for further management of optic neuritis and further  diagnostic workup.    An MRI of the brain showed multiple lesions consistent with MS and she was admitted for IV Solu-Medrol.  ANA, Lyme, RPR and HIV were negative. TSH was mildly low.  After the steroids, she felt that the vision improved quite a bit though it is still not back to baseline.    She does not recall any other episode of neurologic symptoms lasting over a day.    I reviewed the MRI of the brain and orbits performed 12/11/2016. The MRI of the orbits shows enhancement of the left optic nerve. The nerve is also hyperintense on 10 T2 weighted images. The MRI of the brain shows increased signal on diffusion-weighted images with in the optic nerve. Multiple T2/FLAIR hyperintense foci, mostly in the periventricular white matter with some of the foci are radially oriented to the ventricles. One left juxtacortical foci is also seen.   Four or five of the foci enhanced, including 2 of periventricular foci and the one left juxtacortical focus.     She started ocrelizumab April 2018.  MRI cervical spine 12/07/2018: The spinal cord is normal.  Minimal disc bulging at C5-C6 and C6-C7.  MRI of the brain 12/07/2018: Unchanged compared to 08/26/2017.  REVIEW OF SYSTEMS: Constitutional: No fevers, chills, sweats, or change in appetite Eyes: see above.  Ear, nose and throat: No hearing loss, ear pain, nasal congestion, sore throat Cardiovascular: No chest pain.   She has had recent palpitations (recent normal Holter) Respiratory: No shortness of breath at rest or with exertion.   No wheezes GastrointestinaI: No nausea, vomiting, diarrhea, abdominal pain, fecal incontinence Genitourinary: No dysuria, urinary retention or frequency.  No nocturia. Musculoskeletal: No neck pain, back pain Integumentary: No rash, pruritus, skin lesions Neurological: as above Psychiatric: No depression at this time.  No anxiety Endocrine: No palpitations, diaphoresis, change in appetite, change in weigh or increased  thirst Hematologic/Lymphatic: No anemia, purpura, petechiae. Allergic/Immunologic: No itchy/runny eyes, nasal congestion, recent allergic reactions, rashes  ALLERGIES: No Known Allergies  HOME MEDICATIONS:  Current Outpatient Medications:  .  APPLE CIDER VINEGAR PO, Take 1 Dose by mouth daily., Disp: , Rfl:  .  baclofen (LIORESAL) 10 MG tablet, 1/2 to 1 pill up to three times a day, Disp: 90 each, Rfl: 5 .  ibuprofen (ADVIL,MOTRIN) 800 MG tablet, Take 1 tablet (800 mg total) by mouth every 8 (eight) hours as needed for cramping., Disp: 30 tablet, Rfl: 11 .  levocetirizine (XYZAL) 5 MG tablet, Take 1 tablet (5 mg total) by mouth every evening. (Patient taking differently: Take 5 mg by mouth daily as needed. ), Disp: 90 tablet, Rfl: 3 .  meloxicam (MOBIC) 15 MG tablet, Take 1 tablet (15 mg total) by mouth daily., Disp: 30 tablet, Rfl: 5 .  Multiple Vitamins-Minerals (WOMENS MULTI GUMMIES PO), Take by mouth., Disp: , Rfl:  .  ocrelizumab 600 mg in sodium chloride 0.9 % 500 mL, Inject 600 mg into the vein every 6 (six) months., Disp: , Rfl:  .  PREBIOTIC PRODUCT PO, Take by mouth., Disp: , Rfl:  .  Probiotic Product (PROBIOTIC PO), Take by mouth., Disp: , Rfl:  .  scopolamine (TRANSDERM-SCOP) 1 MG/3DAYS, Place onto the skin as needed.  (Patient not taking: Reported on 06/18/2020), Disp: , Rfl:  No current facility-administered medications for this visit.  Facility-Administered Medications Ordered in Other Visits:  .  gadopentetate dimeglumine (MAGNEVIST) injection 14 mL, 14 mL, Intravenous, Once PRN, Chela Sutphen, Pearletha Furl, MD  PAST MEDICAL HISTORY: Past Medical History:  Diagnosis Date  . Palpitations   . Vision abnormalities     PAST SURGICAL HISTORY: No past surgical history on file.  FAMILY HISTORY: Family History  Problem Relation Age of Onset  . Healthy Mother   . Healthy Father     SOCIAL HISTORY:  Social History   Socioeconomic History  . Marital status: Unknown    Spouse  name: Not on file  . Number of children: Not on file  . Years of education: Not on file  . Highest education level: Not on file  Occupational History  . Not on file  Tobacco Use  . Smoking status: Former Smoker    Quit date: 01/14/2012    Years since quitting: 8.4  . Smokeless tobacco: Never Used  Substance and Sexual Activity  . Alcohol use: Yes  . Drug use: No  . Sexual activity: Yes    Birth control/protection: Condom  Other Topics Concern  . Not on file  Social History Narrative  . Not on file   Social Determinants of Health   Financial Resource Strain:   . Difficulty of Paying Living Expenses:   Food Insecurity:   . Worried About Programme researcher, broadcasting/film/video in the Last Year:   . Barista in the Last Year:   [employer]  Needs:   . Lack of Transportation (Medical):   Marland Kitchen Lack of Transportation (Non-Medical):   Physical Activity:   . Days of Exercise per Week:   . Minutes of Exercise per Session:   Stress:   . Feeling of Stress :   Social Connections:   . Frequency of Communication with Friends and Family:   . Frequency of Social Gatherings with Friends and Family:   . Attends Religious Services:   . Active Member of Clubs or Organizations:   . Attends Banker Meetings:   Marland Kitchen Marital Status:   Intimate Partner Violence:   . Fear of Current or Ex-Partner:   . Emotionally Abused:   Marland Kitchen Physically Abused:   . Sexually Abused:      PHYSICAL EXAM  Vitals:   06/18/20 1058  BP: (!) 126/86  Pulse: 92  Weight: 133 lb (60.3 kg)  Height: 5\' 5"  (1.651 m)    Body mass index is 22.13 kg/m.   General: The patient is well-developed and well-nourished and in no acute distress.   She is tender over the lower cervical paraspinal muscles, trapezius and rhomboid muscles.  Range of motion was normal in the neck.   Neurologic Exam  Mental status: The patient is alert and oriented x 3 at the time of the examination. The patient has apparent normal recent and  remote memory, with an apparently normal attention span and concentration ability.   Speech is normal.  Cranial nerves: Extraocular movements are full.  Trapezius strength was normal.   The tongue is midline, and the patient has symmetric elevation of the soft palate. No obvious hearing deficits are noted.  Motor:  Muscle bulk is normal.   Muscle tone is normal.  Strength is 5/5.   Sensory: Not tested today  Coordination: Cerebellar testing shows good finger-nose-finger and heel-to-shin bilaterally..  Gait and station: Station is normal.   The gait and tandem gait are normal.  Reflexes: Deep tendon reflexes are symmetric and normal in arms. DTRs are 3+ at the knees and 2 at the ankles.        DIAGNOSTIC DATA (LABS, IMAGING, TESTING) - I reviewed patient records, labs, notes, testing and imaging myself where available.  Lab Results  Component Value Date   WBC 7.0 06/15/2019   HGB 13.5 06/15/2019   HCT 41.9 06/15/2019   MCV 93 06/15/2019   PLT 296 06/15/2019      Component Value Date/Time   NA 141 06/15/2019 1140   K 4.4 06/15/2019 1140   CL 100 06/15/2019 1140   CO2 24 06/15/2019 1140   GLUCOSE 74 06/15/2019 1140   GLUCOSE 134 (H) 12/13/2016 0757   BUN 10 06/15/2019 1140   CREATININE 0.89 06/15/2019 1140   CALCIUM 9.7 06/15/2019 1140   PROT 6.4 06/15/2019 1140   ALBUMIN 4.8 06/15/2019 1140   AST 13 06/15/2019 1140   ALT 10 06/15/2019 1140   ALKPHOS 56 06/15/2019 1140   BILITOT 0.4 06/15/2019 1140   GFRNONAA 85 06/15/2019 1140   GFRAA 98 06/15/2019 1140    Lab Results  Component Value Date   VITAMINB12 787 12/13/2016   Lab Results  Component Value Date   TSH 0.636 07/12/2019       ASSESSMENT AND PLAN   Multiple sclerosis (HCC) - Plan: MR BRAIN WO CONTRAST, CBC with Differential/Platelet, IgG, IgA, IgM  High risk medication use - Plan: CBC with Differential/Platelet, IgG, IgA, IgM  Other fatigue  History of optic neuritis  Vitamin D  deficiency -  Plan: VITAMIN D 25 Hydroxy (Vit-D Deficiency, Fractures)   1.  Continue Ocrevus for relapsing remitting MS.    check labs.  Check MRI of the brain to determine if there is any subclinical progression and consider a different disease modifying therapy if this is occurring.   2.   Stay active and exercise as tolerated.   3.   Take 5000 U Vit D.   4.   Rtc 6 months, sooner if problems      Davinci Glotfelty A. Epimenio Foot, MD, PhD 06/18/2020, 11:34 AM Certified in Neurology, Clinical Neurophysiology, Sleep Medicine, Pain Medicine and Neuroimaging  [facility] [address], [address] [place], [place] [address] [phone]

## 2020-06-19 ENCOUNTER — Telehealth: Payer: Self-pay | Admitting: *Deleted

## 2020-06-19 ENCOUNTER — Ambulatory Visit (INDEPENDENT_AMBULATORY_CARE_PROVIDER_SITE_OTHER): Payer: BC Managed Care – PPO | Admitting: Psychology

## 2020-06-19 DIAGNOSIS — R7989 Other specified abnormal findings of blood chemistry: Secondary | ICD-10-CM

## 2020-06-19 DIAGNOSIS — F0631 Mood disorder due to known physiological condition with depressive features: Secondary | ICD-10-CM

## 2020-06-19 LAB — CBC WITH DIFFERENTIAL/PLATELET
Basophils Absolute: 0 10*3/uL (ref 0.0–0.2)
Basos: 1 %
EOS (ABSOLUTE): 0 10*3/uL (ref 0.0–0.4)
Eos: 1 %
Hematocrit: 37.5 % (ref 34.0–46.6)
Hemoglobin: 12.1 g/dL (ref 11.1–15.9)
Immature Grans (Abs): 0 10*3/uL (ref 0.0–0.1)
Immature Granulocytes: 0 %
Lymphocytes Absolute: 1.9 10*3/uL (ref 0.7–3.1)
Lymphs: 38 %
MCH: 28.9 pg (ref 26.6–33.0)
MCHC: 32.3 g/dL (ref 31.5–35.7)
MCV: 90 fL (ref 79–97)
Monocytes Absolute: 0.4 10*3/uL (ref 0.1–0.9)
Monocytes: 8 %
Neutrophils Absolute: 2.5 10*3/uL (ref 1.4–7.0)
Neutrophils: 52 %
Platelets: 289 10*3/uL (ref 150–450)
RBC: 4.19 x10E6/uL (ref 3.77–5.28)
RDW: 12.8 % (ref 11.7–15.4)
WBC: 4.8 10*3/uL (ref 3.4–10.8)

## 2020-06-19 LAB — IGG, IGA, IGM
IgA/Immunoglobulin A, Serum: 61 mg/dL — ABNORMAL LOW (ref 87–352)
IgG (Immunoglobin G), Serum: 669 mg/dL (ref 586–1602)
IgM (Immunoglobulin M), Srm: 40 mg/dL (ref 26–217)

## 2020-06-19 LAB — VITAMIN D 25 HYDROXY (VIT D DEFICIENCY, FRACTURES): Vit D, 25-Hydroxy: 25.7 ng/mL — ABNORMAL LOW (ref 30.0–100.0)

## 2020-06-19 MED ORDER — VITAMIN D (ERGOCALCIFEROL) 1.25 MG (50000 UNIT) PO CAPS: 50000.0000 [IU] | ORAL_CAPSULE | ORAL | 1 refills | Status: AC

## 2020-06-19 NOTE — Telephone Encounter (Signed)
-----   Message from Asa Lente, MD sent at 06/19/2020 10:19 AM EDT ----- Vit D is a little low   She should take 40814 units weekly x 26 weeks (13 with refill) then 5000U OTC daily after that

## 2020-06-25 ENCOUNTER — Telehealth: Payer: Self-pay | Admitting: Neurology

## 2020-06-25 NOTE — Telephone Encounter (Signed)
LVM for pt to call back about scheduling mri  [employer] Auth: [id number] (exp. 06/25/20 to 12/21/20)

## 2020-06-26 ENCOUNTER — Ambulatory Visit: Payer: BC Managed Care – PPO | Admitting: Psychology

## 2020-07-05 ENCOUNTER — Ambulatory Visit (INDEPENDENT_AMBULATORY_CARE_PROVIDER_SITE_OTHER): Payer: BC Managed Care – PPO | Admitting: Psychology

## 2020-07-05 DIAGNOSIS — F0631 Mood disorder due to known physiological condition with depressive features: Secondary | ICD-10-CM | POA: Diagnosis not present

## 2020-07-10 ENCOUNTER — Ambulatory Visit: Payer: BC Managed Care – PPO | Admitting: Psychology

## 2020-07-24 ENCOUNTER — Ambulatory Visit: Payer: BC Managed Care – PPO | Admitting: Psychology

## 2020-08-07 ENCOUNTER — Ambulatory Visit: Payer: BC Managed Care – PPO | Admitting: Psychology

## 2020-08-21 ENCOUNTER — Ambulatory Visit: Payer: BC Managed Care – PPO | Admitting: Psychology

## 2020-11-06 DIAGNOSIS — Z0289 Encounter for other administrative examinations: Secondary | ICD-10-CM

## 2020-11-07 NOTE — Telephone Encounter (Signed)
I called the patient to ask the dates she would like placed on the form in the "unable to work" section.  She would like to take an extended leave of absence from her job. She asked to be out from 11/07/20 through 11/06/21.   Per vo by Dr. Epimenio Foot, he will allow this request. The paperwork has been completed, signed and sent to medical records.

## 2020-12-19 ENCOUNTER — Ambulatory Visit: Payer: BC Managed Care – PPO | Admitting: Family Medicine

## 2021-01-03 ENCOUNTER — Telehealth: Payer: Self-pay | Admitting: *Deleted

## 2021-01-03 NOTE — Telephone Encounter (Signed)
Pt united form re faxed on 01/03/21 [phone]

## 2021-01-08 ENCOUNTER — Telehealth: Payer: Self-pay

## 2021-01-08 NOTE — Telephone Encounter (Signed)
I spoke with Liane, RN in the infusion suite. Patient's Ocrevus appointment in January was canceled because patient had not provided her new insurance information.  I called patient to discuss this with her.  No answer, left a message asking her to call us back.  If patient calls back please ask her for her new insurance information and let her speak with the infusion suite to get her Ocrevus infusion scheduled.

## 2021-01-24 ENCOUNTER — Other Ambulatory Visit: Payer: Self-pay | Admitting: *Deleted

## 2021-01-24 ENCOUNTER — Telehealth: Payer: Self-pay | Admitting: Neurology

## 2021-01-24 ENCOUNTER — Encounter: Payer: Self-pay | Admitting: *Deleted

## 2021-01-24 DIAGNOSIS — G35 Multiple sclerosis: Secondary | ICD-10-CM

## 2021-01-24 DIAGNOSIS — Z79899 Other long term (current) drug therapy: Secondary | ICD-10-CM

## 2021-01-24 NOTE — Telephone Encounter (Signed)
..   Pt understands that although there may be some limitations with this type of visit, we will take all precautions to reduce any security or privacy concerns.  Pt understands that this will be treated like an in office visit and we will file with pt's insurance, and there may be a patient responsible charge related to this service. ? ?

## 2021-01-30 NOTE — Telephone Encounter (Signed)
Spoke with Dr. Epimenio Foot. He is okay to infuse her here until she can get established w/ new neurologist. She will need to get lab work prior to infusion when she comes in. Dr. Epimenio Foot reviewed last labs done on 06/18/2020 and ok to infuse per MD.

## 2021-02-06 ENCOUNTER — Encounter: Payer: Self-pay | Admitting: *Deleted

## 2021-02-11 NOTE — Progress Notes (Signed)
No chief complaint on file.    HISTORY OF PRESENT ILLNESS: 02/12/21 ALL:  Brianna Sanders is a 35 y.o. female here today for follow up for RRMS. She continues Ocrevus infusions. Last infusion   She feels she is doing well, today. MS symptoms are stable. Gait is unchanged.   Baclofen 1/2-1 tablet 3 times daily.   meloxicam   Vitamin d   HISTORY (copied from Dr Bonnita Hollow previous note)  Brianna Sanders is a 35 y.o. woman with relapsing remitting MS.  Update 06/18/2020: She is on Ocrevus and has tolerated it well.   Her last infusion was July 2021.    She has no exacerbations while on it.      Gait is doing well and she is active.   She can walk miles without stopping.  No falls and goes up/down stairs without the bannister.    She feels strength and sensation are symmetric.     She denies dysesthesias but gets some cramps, less than before.    Bladder function is ok.  She takes nitrofurantoin if she suspects she might have a UTI.   For a while she was taking daily but she has difficulty swallowing pills and change.  Vision is doing well  She has some fatigue and rests.   She still accomplishes all her tasks.   She sleeps well most nights, getting 6-7 hours many nights.   Mood and cognition are doing well.    She talks to [place] every couple weeks at [employer].   She may be moving to [place] or [place] soon.      She had the Covid vaccination in March.  We discussed efficacy may be reduced for patients on Ocrevus.      MS History:  On 11/30/2016, she had the onset of blurred vision and pain behind the left eye. She saw one ophthalmologist and told she had iritis and she was prescribed eye drops.    She saw another ophthalmologist 12/11/2016 and was referred to the [facility] emergency room for further management of optic neuritis and further diagnostic workup.    An MRI of the brain showed multiple lesions consistent with MS and she was admitted for IV Solu-Medrol.  ANA, Lyme,  RPR and HIV were negative. TSH was mildly low.  After the steroids, she felt that the vision improved quite a bit though it is still not back to baseline.    She does not recall any other episode of neurologic symptoms lasting over a day.    I reviewed the MRI of the brain and orbits performed 12/11/2016. The MRI of the orbits shows enhancement of the left optic nerve. The nerve is also hyperintense on 10 T2 weighted images. The MRI of the brain shows increased signal on diffusion-weighted images with in the optic nerve. Multiple T2/FLAIR hyperintense foci, mostly in the periventricular white matter with some of the foci are radially oriented to the ventricles. One left juxtacortical foci is also seen.   Four or five of the foci enhanced, including 2 of periventricular foci and the one left juxtacortical focus.     She started ocrelizumab April 2018.  MRI cervical spine 12/07/2018: The spinal cord is normal.  Minimal disc bulging at C5-C6 and C6-C7.  MRI of the brain 12/07/2018: Unchanged compared to 08/26/2017.    REVIEW OF SYSTEMS: Out of a complete 14 system review of symptoms, the patient complains only of the following symptoms, and all other reviewed systems are  negative.    ALLERGIES: No Known Allergies   HOME MEDICATIONS: Outpatient Medications Prior to Visit  Medication Sig Dispense Refill  . APPLE CIDER VINEGAR PO Take 1 Dose by mouth daily.    . baclofen (LIORESAL) 10 MG tablet 1/2 to 1 pill up to three times a day 90 each 5  . ibuprofen (ADVIL,MOTRIN) 800 MG tablet Take 1 tablet (800 mg total) by mouth every 8 (eight) hours as needed for cramping. 30 tablet 11  . levocetirizine (XYZAL) 5 MG tablet Take 1 tablet (5 mg total) by mouth every evening. (Patient taking differently: Take 5 mg by mouth daily as needed. ) 90 tablet 3  . meloxicam (MOBIC) 15 MG tablet Take 1 tablet (15 mg total) by mouth daily. 30 tablet 5  . Multiple Vitamins-Minerals (WOMENS MULTI GUMMIES PO) Take by  mouth.    Marland Kitchen ocrelizumab 600 mg in sodium chloride 0.9 % 500 mL Inject 600 mg into the vein every 6 (six) months.    Marland Kitchen PREBIOTIC PRODUCT PO Take by mouth.    . Probiotic Product (PROBIOTIC PO) Take by mouth.    Marland Kitchen scopolamine (TRANSDERM-SCOP) 1 MG/3DAYS Place onto the skin as needed.  (Patient not taking: Reported on 06/18/2020)    . Vitamin D, Ergocalciferol, (DRISDOL) 1.25 MG (50000 UNIT) CAPS capsule Take 1 capsule (50,000 Units total) by mouth every 7 (seven) days. 13 capsule 1   Facility-Administered Medications Prior to Visit  Medication Dose Route Frequency Provider Last Rate Last Admin  . gadopentetate dimeglumine (MAGNEVIST) injection 14 mL  14 mL Intravenous Once PRN Sater, Pearletha Furl, MD         PAST MEDICAL HISTORY: Past Medical History:  Diagnosis Date  . Palpitations   . Vision abnormalities      PAST SURGICAL HISTORY: No past surgical history on file.   FAMILY HISTORY: Family History  Problem Relation Age of Onset  . Healthy Mother   . Healthy Father      SOCIAL HISTORY: Social History   Socioeconomic History  . Marital status: Unknown    Spouse name: Not on file  . Number of children: Not on file  . Years of education: Not on file  . Highest education level: Not on file  Occupational History  . Not on file  Tobacco Use  . Smoking status: Former Smoker    Quit date: 01/14/2012    Years since quitting: 9.0  . Smokeless tobacco: Never Used  Substance and Sexual Activity  . Alcohol use: Yes  . Drug use: No  . Sexual activity: Yes    Birth control/protection: Condom  Other Topics Concern  . Not on file  Social History Narrative  . Not on file   Social Determinants of Health   Financial Resource Strain: Not on file  Food Insecurity: Not on file  Transportation Needs: Not on file  Physical Activity: Not on file  Stress: Not on file  Social Connections: Not on file  Intimate Partner Violence: Not on file      PHYSICAL EXAM  There were no  vitals filed for this visit. There is no height or weight on file to calculate BMI.   Generalized: Well developed, in no acute distress  Cardiology: normal rate and rhythm, no murmur auscultated  Respiratory: clear to auscultation bilaterally    Neurological examination  Mentation: Alert oriented to time, place, history taking. Follows all commands speech and language fluent Cranial nerve II-XII: Pupils were equal round reactive to light. Extraocular movements  were full, visual field were full on confrontational test. Facial sensation and strength were normal. Uvula tongue midline. Head turning and shoulder shrug  were normal and symmetric. Motor: The motor testing reveals 5 over 5 strength of all 4 extremities. Good symmetric motor tone is noted throughout.  Sensory: Sensory testing is intact to soft touch on all 4 extremities. No evidence of extinction is noted.  Coordination: Cerebellar testing reveals good finger-nose-finger and heel-to-shin bilaterally.  Gait and station: Gait is normal. Tandem gait is normal. Romberg is negative. No drift is seen.  Reflexes: Deep tendon reflexes are symmetric and normal bilaterally.     DIAGNOSTIC DATA (LABS, IMAGING, TESTING) - I reviewed patient records, labs, notes, testing and imaging myself where available.  Lab Results  Component Value Date   WBC 4.8 06/18/2020   HGB 12.1 06/18/2020   HCT 37.5 06/18/2020   MCV 90 06/18/2020   PLT 289 06/18/2020      Component Value Date/Time   NA 141 06/15/2019 1140   K 4.4 06/15/2019 1140   CL 100 06/15/2019 1140   CO2 24 06/15/2019 1140   GLUCOSE 74 06/15/2019 1140   GLUCOSE 134 (H) 12/13/2016 0757   BUN 10 06/15/2019 1140   CREATININE 0.89 06/15/2019 1140   CALCIUM 9.7 06/15/2019 1140   PROT 6.4 06/15/2019 1140   ALBUMIN 4.8 06/15/2019 1140   AST 13 06/15/2019 1140   ALT 10 06/15/2019 1140   ALKPHOS 56 06/15/2019 1140   BILITOT 0.4 06/15/2019 1140   GFRNONAA 85 06/15/2019 1140   GFRAA  98 06/15/2019 1140   No results found for: CHOL, HDL, LDLCALC, LDLDIRECT, TRIG, CHOLHDL No results found for: DJTT0V Lab Results  Component Value Date   VITAMINB12 787 12/13/2016   Lab Results  Component Value Date   TSH 0.636 07/12/2019    No flowsheet data found.   No flowsheet data found.   ASSESSMENT AND PLAN  35 y.o. year old female  has a past medical history of Palpitations and Vision abnormalities. here with   Relapsing remitting multiple sclerosis (HCC)  High risk medication use  Vitamin D deficiency    No orders of the defined types were placed in this encounter.    No orders of the defined types were placed in this encounter.     I spent 20 minutes of face-to-face and non-face-to-face time with patient.  This included previsit chart review, lab review, study review, order entry, electronic health record documentation, patient education.    Shawnie Dapper, MSN, FNP-C 02/12/2021, 7:47 AM  [facility] [address] [place], [place] [address] [phone]

## 2021-02-12 ENCOUNTER — Encounter: Payer: Self-pay | Admitting: Family Medicine

## 2021-02-12 DIAGNOSIS — G35 Multiple sclerosis: Secondary | ICD-10-CM

## 2021-02-12 DIAGNOSIS — E559 Vitamin D deficiency, unspecified: Secondary | ICD-10-CM

## 2021-02-12 DIAGNOSIS — Z79899 Other long term (current) drug therapy: Secondary | ICD-10-CM

## 2021-02-22 ENCOUNTER — Telehealth: Payer: Self-pay | Admitting: Neurology

## 2021-02-22 NOTE — Telephone Encounter (Signed)
Created in error

## 2021-04-22 ENCOUNTER — Other Ambulatory Visit: Payer: Self-pay | Admitting: Neurology

## 2021-04-22 MED ORDER — NIRMATRELVIR/RITONAVIR (PAXLOVID)TABLET
3.0000 | ORAL_TABLET | Freq: Two times a day (BID) | ORAL | 0 refills | Status: AC
Start: 2021-04-22 — End: 2021-04-27

## 2021-09-02 ENCOUNTER — Encounter: Payer: Self-pay | Admitting: *Deleted
